# Patient Record
Sex: Male | Born: 1980 | Race: White | Hispanic: No | Marital: Single | Smoking: Former smoker
Health system: Southern US, Community
[De-identification: ages and names within clinical notes are randomized; demographics above are authoritative.]

## PROBLEM LIST (undated history)

## (undated) DIAGNOSIS — K219 Gastro-esophageal reflux disease without esophagitis: Secondary | ICD-10-CM

## (undated) DIAGNOSIS — B019 Varicella without complication: Secondary | ICD-10-CM

## (undated) DIAGNOSIS — J302 Other seasonal allergic rhinitis: Secondary | ICD-10-CM

## (undated) DIAGNOSIS — J45909 Unspecified asthma, uncomplicated: Secondary | ICD-10-CM

## (undated) HISTORY — DX: Unspecified asthma, uncomplicated: J45.909

## (undated) HISTORY — DX: Varicella without complication: B01.9

## (undated) HISTORY — PX: NECK SURGERY: SHX720

## (undated) HISTORY — PX: LEG SURGERY: SHX1003

## (undated) HISTORY — DX: Other seasonal allergic rhinitis: J30.2

## (undated) HISTORY — DX: Gastro-esophageal reflux disease without esophagitis: K21.9

---

## 2005-08-26 ENCOUNTER — Inpatient Hospital Stay (HOSPITAL_COMMUNITY): Admission: EM | Admit: 2005-08-26 | Discharge: 2005-08-28 | Payer: Self-pay | Admitting: *Deleted

## 2015-08-17 ENCOUNTER — Ambulatory Visit (INDEPENDENT_AMBULATORY_CARE_PROVIDER_SITE_OTHER): Payer: BLUE CROSS/BLUE SHIELD | Admitting: Primary Care

## 2015-08-17 ENCOUNTER — Encounter: Payer: Self-pay | Admitting: Primary Care

## 2015-08-17 ENCOUNTER — Ambulatory Visit (INDEPENDENT_AMBULATORY_CARE_PROVIDER_SITE_OTHER)
Admission: RE | Admit: 2015-08-17 | Discharge: 2015-08-17 | Disposition: A | Discharge status: Home / Self Care | Payer: BLUE CROSS/BLUE SHIELD | Source: Ambulatory Visit | Attending: Primary Care | Admitting: Primary Care

## 2015-08-17 VITALS — BP 146/92 | HR 85 | Temp 98.1°F | Ht 69.25 in | Wt 197.0 lb

## 2015-08-17 DIAGNOSIS — J45909 Unspecified asthma, uncomplicated: Secondary | ICD-10-CM

## 2015-08-17 DIAGNOSIS — K219 Gastro-esophageal reflux disease without esophagitis: Secondary | ICD-10-CM

## 2015-08-17 DIAGNOSIS — M542 Cervicalgia: Secondary | ICD-10-CM | POA: Diagnosis not present

## 2015-08-17 DIAGNOSIS — M79605 Pain in left leg: Secondary | ICD-10-CM

## 2015-08-17 MED ORDER — ALBUTEROL SULFATE HFA 108 (90 BASE) MCG/ACT IN AERS: 2.0000 | INHALATION_SPRAY | Freq: Four times a day (QID) | RESPIRATORY_TRACT | Status: DC | PRN

## 2015-08-17 MED ORDER — OMEPRAZOLE 20 MG PO CPDR: 20.0000 mg | DELAYED_RELEASE_CAPSULE | Freq: Every day | ORAL | Status: DC

## 2015-08-17 MED ORDER — CETIRIZINE HCL 10 MG PO TABS: 10.0000 mg | ORAL_TABLET | Freq: Every day | ORAL | Status: DC

## 2015-08-17 NOTE — Patient Instructions (Addendum)
Complete xray(s) prior to leaving today. I will notify you of your results once received.  Stop by the front desk and speak with either Shirlee LimerickMarion or Revonda StandardAllison regarding your referral to orthopedics.  Start Omeprazole 20 mg tablets for acid reflux. Take 1 tablet by mouth every morning.  Start Certrazine (Zyrtec) everyday for allergy and asthma. Take 1 tablet by mouth at bedtime.  Use your albuterol inhaler every 6 hours as needed for wheezing and shortness of breath. Please notify me if you require this inhaler more than 2-3 times weekly.  Please schedule a physical with me within the next 6 months. You may also schedule a lab only appointment 3-4 days prior. We will discuss your lab results in detail during your physical.  It was a pleasure to meet you today! Please don't hesitate to call me with any questions. Welcome to [facility]!  Food Choices for Gastroesophageal Reflux Disease, Adult When you have gastroesophageal reflux disease (GERD), the foods you eat and your eating habits are very important. Choosing the right foods can help ease the discomfort of GERD. WHAT GENERAL GUIDELINES DO I NEED TO FOLLOW?  Choose fruits, vegetables, whole grains, low-fat dairy products, and low-fat meat, fish, and poultry.  Limit fats such as oils, salad dressings, butter, nuts, and avocado.  Keep a food diary to identify foods that cause symptoms.  Avoid foods that cause reflux. These may be different for different people.  Eat frequent small meals instead of three large meals each day.  Eat your meals slowly, in a relaxed setting.  Limit fried foods.  Cook foods using methods other than frying.  Avoid drinking alcohol.  Avoid drinking large amounts of liquids with your meals.  Avoid bending over or lying down until 2-3 hours after eating. WHAT FOODS ARE NOT RECOMMENDED? The following are some foods and drinks that may worsen your symptoms: Vegetables Tomatoes. Tomato juice. Tomato and  spaghetti sauce. Chili peppers. Onion and garlic. Horseradish. Fruits Oranges, grapefruit, and lemon (fruit and juice). Meats High-fat meats, fish, and poultry. This includes hot dogs, ribs, ham, sausage, salami, and bacon. Dairy Whole milk and chocolate milk. Sour cream. Cream. Butter. Ice cream. Cream cheese.  Beverages Coffee and tea, with or without caffeine. Carbonated beverages or energy drinks. Condiments Hot sauce. Barbecue sauce.  Sweets/Desserts Chocolate and cocoa. Donuts. Peppermint and spearmint. Fats and Oils High-fat foods, including [place] fries and potato chips. Other Vinegar. Strong spices, such as black pepper, white pepper, red pepper, cayenne, curry powder, cloves, ginger, and chili powder. The items listed above may not be a complete list of foods and beverages to avoid. Contact your dietitian for more information.   This information is not intended to replace advice given to you by your health care provider. Make sure you discuss any questions you have with your health care provider.   Document Released: 05/26/2005 Document Revised: 06/16/2014 Document Reviewed: 03/30/2013 Elsevier Interactive Patient Education [employer]2016 Elsevier Inc.

## 2015-08-17 NOTE — Progress Notes (Signed)
Pre visit review using our clinic review tool, if applicable. No additional management support is needed unless otherwise documented below in the visit note. 
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
## 2015-08-18 DIAGNOSIS — J45909 Unspecified asthma, uncomplicated: Secondary | ICD-10-CM | POA: Insufficient documentation

## 2015-08-18 DIAGNOSIS — M542 Cervicalgia: Secondary | ICD-10-CM | POA: Insufficient documentation

## 2015-08-18 DIAGNOSIS — K219 Gastro-esophageal reflux disease without esophagitis: Secondary | ICD-10-CM | POA: Insufficient documentation

## 2015-08-18 DIAGNOSIS — M79605 Pain in left leg: Secondary | ICD-10-CM | POA: Insufficient documentation

## 2015-08-18 HISTORY — DX: Gastro-esophageal reflux disease without esophagitis: K21.9

## 2015-08-18 NOTE — Assessment & Plan Note (Signed)
Classic symptoms that occur daily. Poor diet, current smoker.  Information provided regarding triggers. Will have him start omeprazole 20 mg daily x 4 weeks.

## 2015-08-18 NOTE — Assessment & Plan Note (Signed)
Chronic, now with radiation to right shoulder.  Tender to C5-6 region. Xray today negative. May need MRI. Will have him discuss with ortho.
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
## 2015-08-18 NOTE — Assessment & Plan Note (Signed)
History of dating to childhood. Current smoker. Endorses wheezing in AM with cough. Discussed importance of tobacco cessation. Albuterol inhaler sent to pharmacy. Also will have him start Zyrtec HS. He is to notify me if he's requiring albuterol more than 2-3 times weekly. Will consider spirometry in the future.

## 2015-11-13 DIAGNOSIS — M4722 Other spondylosis with radiculopathy, cervical region: Secondary | ICD-10-CM | POA: Diagnosis not present

## 2015-11-13 DIAGNOSIS — M50223 Other cervical disc displacement at C6-C7 level: Secondary | ICD-10-CM | POA: Diagnosis not present

## 2015-11-13 DIAGNOSIS — M5020 Other cervical disc displacement, unspecified cervical region: Secondary | ICD-10-CM | POA: Diagnosis not present

## 2015-11-13 DIAGNOSIS — M50123 Cervical disc disorder at C6-C7 level with radiculopathy: Secondary | ICD-10-CM | POA: Diagnosis not present

## 2015-12-05 DIAGNOSIS — M50223 Other cervical disc displacement at C6-C7 level: Secondary | ICD-10-CM | POA: Diagnosis not present

## 2016-01-28 DIAGNOSIS — M47812 Spondylosis without myelopathy or radiculopathy, cervical region: Secondary | ICD-10-CM | POA: Diagnosis not present

## 2016-02-05 ENCOUNTER — Encounter: Payer: Self-pay | Admitting: Primary Care

## 2016-02-05 ENCOUNTER — Other Ambulatory Visit: Payer: Self-pay | Admitting: Primary Care

## 2016-02-05 DIAGNOSIS — Z0000 Encounter for general adult medical examination without abnormal findings: Secondary | ICD-10-CM

## 2016-02-14 ENCOUNTER — Other Ambulatory Visit: Payer: BLUE CROSS/BLUE SHIELD

## 2016-02-19 ENCOUNTER — Ambulatory Visit: Payer: BLUE CROSS/BLUE SHIELD | Admitting: Primary Care

## 2016-02-19 ENCOUNTER — Encounter: Payer: BLUE CROSS/BLUE SHIELD | Admitting: Primary Care

## 2016-02-19 DIAGNOSIS — Z0289 Encounter for other administrative examinations: Secondary | ICD-10-CM

## 2016-04-23 ENCOUNTER — Other Ambulatory Visit: Payer: Self-pay | Admitting: Primary Care

## 2016-04-23 DIAGNOSIS — J45909 Unspecified asthma, uncomplicated: Secondary | ICD-10-CM

## 2016-09-03 DIAGNOSIS — M50223 Other cervical disc displacement at C6-C7 level: Secondary | ICD-10-CM | POA: Diagnosis not present

## 2016-09-03 DIAGNOSIS — M47812 Spondylosis without myelopathy or radiculopathy, cervical region: Secondary | ICD-10-CM | POA: Diagnosis not present

## 2016-09-03 DIAGNOSIS — M542 Cervicalgia: Secondary | ICD-10-CM | POA: Diagnosis not present

## 2016-09-03 DIAGNOSIS — M5412 Radiculopathy, cervical region: Secondary | ICD-10-CM | POA: Diagnosis not present

## 2016-09-04 ENCOUNTER — Other Ambulatory Visit: Payer: Self-pay | Admitting: Neurosurgery

## 2016-09-04 DIAGNOSIS — M5412 Radiculopathy, cervical region: Secondary | ICD-10-CM

## 2016-09-05 ENCOUNTER — Other Ambulatory Visit: Payer: Self-pay | Admitting: Neurosurgery

## 2016-09-05 DIAGNOSIS — M542 Cervicalgia: Secondary | ICD-10-CM

## 2016-09-22 ENCOUNTER — Ambulatory Visit
Admission: RE | Admit: 2016-09-22 | Discharge: 2016-09-22 | Disposition: A | Discharge status: Home / Self Care | Payer: BLUE CROSS/BLUE SHIELD | Source: Ambulatory Visit | Attending: Neurosurgery | Admitting: Neurosurgery

## 2016-09-22 DIAGNOSIS — M542 Cervicalgia: Secondary | ICD-10-CM

## 2016-09-28 ENCOUNTER — Other Ambulatory Visit: Payer: Self-pay | Admitting: Primary Care

## 2016-09-28 DIAGNOSIS — J45909 Unspecified asthma, uncomplicated: Secondary | ICD-10-CM

## 2016-11-25 ENCOUNTER — Other Ambulatory Visit: Payer: Self-pay | Admitting: Primary Care

## 2016-11-25 DIAGNOSIS — J45909 Unspecified asthma, uncomplicated: Secondary | ICD-10-CM

## 2017-07-27 DIAGNOSIS — M542 Cervicalgia: Secondary | ICD-10-CM | POA: Diagnosis not present

## 2017-07-27 DIAGNOSIS — M50223 Other cervical disc displacement at C6-C7 level: Secondary | ICD-10-CM | POA: Diagnosis not present

## 2017-07-27 DIAGNOSIS — M4802 Spinal stenosis, cervical region: Secondary | ICD-10-CM | POA: Diagnosis not present

## 2017-07-27 DIAGNOSIS — M47812 Spondylosis without myelopathy or radiculopathy, cervical region: Secondary | ICD-10-CM | POA: Diagnosis not present

## 2017-07-27 DIAGNOSIS — M5412 Radiculopathy, cervical region: Secondary | ICD-10-CM | POA: Diagnosis not present

## 2017-09-28 ENCOUNTER — Ambulatory Visit: Payer: BLUE CROSS/BLUE SHIELD | Admitting: Primary Care

## 2017-09-28 DIAGNOSIS — Z0289 Encounter for other administrative examinations: Secondary | ICD-10-CM

## 2017-09-28 DIAGNOSIS — M542 Cervicalgia: Secondary | ICD-10-CM | POA: Diagnosis not present

## 2017-09-28 DIAGNOSIS — R0789 Other chest pain: Secondary | ICD-10-CM | POA: Diagnosis not present

## 2017-09-29 DIAGNOSIS — M542 Cervicalgia: Secondary | ICD-10-CM | POA: Diagnosis not present

## 2017-09-29 DIAGNOSIS — R030 Elevated blood-pressure reading, without diagnosis of hypertension: Secondary | ICD-10-CM | POA: Diagnosis not present

## 2017-09-29 DIAGNOSIS — S129XXA Fracture of neck, unspecified, initial encounter: Secondary | ICD-10-CM | POA: Diagnosis not present

## 2017-09-29 DIAGNOSIS — M5412 Radiculopathy, cervical region: Secondary | ICD-10-CM | POA: Diagnosis not present

## 2017-10-05 ENCOUNTER — Ambulatory Visit: Payer: BLUE CROSS/BLUE SHIELD | Admitting: Primary Care

## 2017-10-05 ENCOUNTER — Encounter: Payer: Self-pay | Admitting: Primary Care

## 2017-10-05 DIAGNOSIS — K219 Gastro-esophageal reflux disease without esophagitis: Secondary | ICD-10-CM

## 2017-10-05 DIAGNOSIS — J4530 Mild persistent asthma, uncomplicated: Secondary | ICD-10-CM

## 2017-10-05 DIAGNOSIS — J45909 Unspecified asthma, uncomplicated: Secondary | ICD-10-CM

## 2017-10-05 DIAGNOSIS — M542 Cervicalgia: Secondary | ICD-10-CM

## 2017-10-05 MED ORDER — CETIRIZINE HCL 10 MG PO TABS: 10.0000 mg | ORAL_TABLET | Freq: Every day | ORAL | 3 refills | Status: DC

## 2017-10-05 MED ORDER — ALBUTEROL SULFATE HFA 108 (90 BASE) MCG/ACT IN AERS: 2.0000 | INHALATION_SPRAY | Freq: Four times a day (QID) | RESPIRATORY_TRACT | 0 refills | Status: DC | PRN

## 2017-10-05 MED ORDER — OMEPRAZOLE 20 MG PO CPDR: 20.0000 mg | DELAYED_RELEASE_CAPSULE | Freq: Every day | ORAL | 3 refills | Status: DC

## 2017-10-05 MED ORDER — FLUTICASONE PROPIONATE HFA 44 MCG/ACT IN AERO: 2.0000 | INHALATION_SPRAY | Freq: Two times a day (BID) | RESPIRATORY_TRACT | 5 refills | Status: DC

## 2017-10-05 NOTE — Patient Instructions (Signed)
Start fluticasone inhaler. Inhale 2 puffs into the lungs twice daily, everyday.   Use the albuterol inhaler only as needed. Please call me if you still require use of this more than three times weekly on average.  Start omeprazole 20 mg capsules for heartburn.   Start Zyrtec once daily for allergies.  Please call me if the new inhaler is too expensive and/or is not effective.  It was a pleasure to see you today!

## 2017-10-05 NOTE — Assessment & Plan Note (Signed)
Daily symptoms with daily consumption of Tums. Discussed triggers including tobacco abuse, he is working to cut back.  Refill of omeprazole sent to pharmacy.

## 2017-10-05 NOTE — Assessment & Plan Note (Signed)
Daily use of albuterol, 365 days annually, which means asthma is not well controlled.   Start ICS with Flovent inhaler. Will start with 88 mcg BID then titrate as needed.  Discussed to call if ineffective or too costly. Also discussed to call if he's needing albuterol more than three times weekly. No wheezing on exam.

## 2017-10-05 NOTE — Assessment & Plan Note (Signed)
Chronic and following with orthopedics. Will likely undergo surgical intervention.

## 2017-10-05 NOTE — Progress Notes (Signed)
Subjective:    Patient ID: Luke Williams, male    DOB: May 02, 1981, 37 y.o.   MRN: [id number]  HPI  Luke Williams is a 37 year old male with a history of chronic neck pain, asthma, allergies, GERD who presents today for medication refill.  1) Chronic Neck Pain: History of cervical spinal fusion in 2006. Based off of PMP Aware he received a three day supply of Norco on 09/28/17 and a fifteen day supply of Percocet on 09/30/17. He is currently following with Orthopedics who is recommending surgical intervention.    2) GERD: Once managed on omeprazole 20 mg, is needing refills. He experiences esophageal burning and reflux daily and is taking two Tums daily on average.   3) Asthma/Allergies: Currently managed on albuterol HFA and Zyrtec. He's using his inhaler every morning for shortness of breath and wheezing. He's not had his Zyrtec at bedtime recently, was compliant before with improvement.   Review of Systems  Constitutional: Negative for fever.  HENT: Negative for congestion.   Respiratory: Positive for shortness of breath and wheezing. Negative for cough.   Gastrointestinal:       GERD  Musculoskeletal:       Chronic neck pain  Allergic/Immunologic: Positive for environmental allergies.       Past Medical History:  Diagnosis Date  . Asthma   . Chickenpox   . GERD (gastroesophageal reflux disease)   . Seasonal allergies      Social History   Socioeconomic History  . Marital status: Single    Spouse name: Not on file  . Number of children: Not on file  . Years of education: Not on file  . Highest education level: Not on file  Occupational History  . Not on file  Social Needs  . Financial resource strain: Not on file  . Food insecurity:    Worry: Not on file    Inability: Not on file  . Transportation needs:    Medical: Not on file    Non-medical: Not on file  Tobacco Use  . Smoking status: Current Every Day Smoker    Packs/day: 1.00    Types: Cigarettes  .  Smokeless tobacco: Never Used  Substance and Sexual Activity  . Alcohol use: No    Alcohol/week: 0.0 oz  . Drug use: No  . Sexual activity: Not on file  Lifestyle  . Physical activity:    Days per week: Not on file    Minutes per session: Not on file  . Stress: Not on file  Relationships  . Social connections:    Talks on phone: Not on file    Gets together: Not on file    Attends religious service: Not on file    Active member of club or organization: Not on file    Attends meetings of clubs or organizations: Not on file    Relationship status: Not on file  . Intimate partner violence:    Fear of current or ex partner: Not on file    Emotionally abused: Not on file    Physically abused: Not on file    Forced sexual activity: Not on file  Other Topics Concern  . Not on file  Social History Narrative   Married, separated.   2 children.   Works in [place].   Enjoys relaxing, golfing.      Family History  Problem Relation Age of Onset  . Arthritis Mother   . Hypertension Mother   . Alcohol abuse  Father   . Hyperlipidemia Father   . Hypertension Father   . Arthritis Maternal Grandmother   . Stroke Maternal Grandmother   . Hypertension Maternal Grandmother   . Colon cancer Maternal Grandfather   . Stroke Maternal Grandfather     No Known Allergies  Current Outpatient Medications on File Prior to Visit  Medication Sig Dispense Refill  . albuterol (PROAIR HFA) 108 (90 Base) MCG/ACT inhaler Inhale 2 puffs into the lungs every 6 (six) hours as needed for wheezing or shortness of breath. NEED OFFICE VISIT FOR ANY MORE REFILLS 8.5 Inhaler 0  . cetirizine (ZYRTEC) 10 MG tablet Take 1 tablet (10 mg total) by mouth daily. 30 tablet 11  . omeprazole (PRILOSEC) 20 MG capsule Take 1 capsule (20 mg total) by mouth daily. 30 capsule 11   No current facility-administered medications on file prior to visit.     BP 134/86   Pulse 82   Temp 98.3 F (36.8 C) (Oral)   Ht 5'  9.25" (1.759 m)   Wt 194 lb 12 oz (88.3 kg)   SpO2 98%   BMI 28.55 kg/m    Objective:   Physical Exam  Cardiovascular: Normal rate and regular rhythm.  Pulmonary/Chest: Breath sounds normal. He has no wheezes.  Abdominal: Soft.  Skin: Skin is warm and dry.          Assessment & Plan:

## 2017-10-12 ENCOUNTER — Other Ambulatory Visit: Payer: Self-pay | Admitting: Neurosurgery

## 2017-11-09 NOTE — Pre-Procedure Instructions (Signed)
Erroll LunaJoseph V Wilbanks  11/09/2017      [id number]- [place], [place] - [address] [place] [id number] Phone: [phone] Fax: [phone]    Your procedure is scheduled on June 11  Report to [facility] Admitting at [phone] A.M.  Call this number if you have problems the morning of surgery:  [phone]   Remember:  NOTHING TO EAT OR DRINK AFTER MIDNIGHT    Take these medicines the morning of surgery with A SIP OF WATER  albuterol (PROAIR HFA)  cetirizine (ZYRTEC)  omeprazole (PRILOSEC) oxyCODONE-acetaminophen (PERCOCET)   7 days prior to surgery STOP taking any Aspirin(unless otherwise instructed by your surgeon), Aleve, Naproxen, Ibuprofen, Motrin, Advil, Goody's, BC's, all herbal medications, fish oil, and all vitamins       Do not wear jewelry, make-up or nail polish.  Do not wear lotions, powders, or COLOGNE, or deodorant.    Men may shave face and neck.  Do not bring valuables to the hospital.  [facility] is not responsible for any belongings or valuables.  Contacts, dentures or bridgework may not be worn into surgery.  Leave your suitcase in the car.  After surgery it may be brought to your room.  For patients admitted to the hospital, discharge time will be determined by your treatment team.  Patients discharged the day of surgery will not be allowed to drive home.    Special instructions:   [facility]- Preparing For Surgery  Before surgery, you can play an important role. Because skin is not sterile, your skin needs to be as free of germs as possible. You can reduce the number of germs on your skin by washing with CHG (chlorahexidine gluconate) Soap before surgery.  CHG is an antiseptic cleaner which kills germs and bonds with the skin to continue killing germs even after washing.    Oral Hygiene is also important to reduce your risk of infection.  Remember - BRUSH YOUR TEETH THE MORNING OF SURGERY WITH YOUR  REGULAR TOOTHPASTE  Please do not use if you have an allergy to CHG or antibacterial soaps. If your skin becomes reddened/irritated stop using the CHG.  Do not shave (including legs and underarms) for at least 48 hours prior to first CHG shower. It is OK to shave your face.  Please follow these instructions carefully.   1. Shower the NIGHT BEFORE SURGERY and the MORNING OF SURGERY with CHG.   2. If you chose to wash your hair, wash your hair first as usual with your normal shampoo.  3. After you shampoo, rinse your hair and body thoroughly to remove the shampoo.  4. Use CHG as you would any other liquid soap. You can apply CHG directly to the skin and wash gently with a scrungie or a clean washcloth.   5. Apply the CHG Soap to your body ONLY FROM THE NECK DOWN.  Do not use on open wounds or open sores. Avoid contact with your eyes, ears, mouth and genitals (private parts). Wash Face and genitals (private parts)  with your normal soap.  6. Wash thoroughly, paying special attention to the area where your surgery will be performed.  7. Thoroughly rinse your body with warm water from the neck down.  8. DO NOT shower/wash with your normal soap after using and rinsing off the CHG Soap.  9. Pat yourself dry with a CLEAN TOWEL.  10. Wear CLEAN PAJAMAS to bed the night before surgery,  wear comfortable clothes the morning of surgery  11. Place CLEAN SHEETS on your bed the night of your first shower and DO NOT SLEEP WITH PETS.    Day of Surgery:  Do not apply any deodorants/lotions.  Please wear clean clothes to the hospital/surgery center.   Remember to brush your teeth WITH YOUR REGULAR TOOTHPASTE.    Please read over the following fact sheets that you were given.

## 2017-11-10 ENCOUNTER — Encounter (HOSPITAL_COMMUNITY): Payer: Self-pay

## 2017-11-10 ENCOUNTER — Encounter (HOSPITAL_COMMUNITY)
Admission: RE | Admit: 2017-11-10 | Discharge: 2017-11-10 | Disposition: A | Discharge status: Home / Self Care | Payer: BLUE CROSS/BLUE SHIELD | Source: Ambulatory Visit | Attending: Neurosurgery | Admitting: Neurosurgery

## 2017-11-10 ENCOUNTER — Other Ambulatory Visit: Payer: Self-pay

## 2017-11-10 DIAGNOSIS — M542 Cervicalgia: Secondary | ICD-10-CM | POA: Diagnosis not present

## 2017-11-10 DIAGNOSIS — Z01818 Encounter for other preprocedural examination: Secondary | ICD-10-CM | POA: Diagnosis not present

## 2017-11-10 LAB — CBC
HEMATOCRIT: 35.9 % — AB (ref 39.0–52.0)
HEMOGLOBIN: 10.4 g/dL — AB (ref 13.0–17.0)
MCH: 20.1 pg — ABNORMAL LOW (ref 26.0–34.0)
MCHC: 29 g/dL — ABNORMAL LOW (ref 30.0–36.0)
MCV: 69.3 fL — ABNORMAL LOW (ref 78.0–100.0)
Platelets: 316 10*3/uL (ref 150–400)
RBC: 5.18 MIL/uL (ref 4.22–5.81)
RDW: 20.9 % — ABNORMAL HIGH (ref 11.5–15.5)
WBC: 4.6 10*3/uL (ref 4.0–10.5)

## 2017-11-10 LAB — TYPE AND SCREEN
ABO/RH(D): A NEG
ANTIBODY SCREEN: NEGATIVE

## 2017-11-10 LAB — SURGICAL PCR SCREEN
MRSA, PCR: NEGATIVE
STAPHYLOCOCCUS AUREUS: NEGATIVE

## 2017-11-15 ENCOUNTER — Other Ambulatory Visit: Payer: Self-pay | Admitting: Primary Care

## 2017-11-15 DIAGNOSIS — J45909 Unspecified asthma, uncomplicated: Secondary | ICD-10-CM

## 2017-11-16 NOTE — Telephone Encounter (Signed)
Ok to refill? Electronically refill request for albuterol [facility](PROAIR HFA) 108 (90 Base) MCG/ACT inhaler  Last prescribed on 10/05/2017  Last seen on 10/05/2017

## 2017-11-17 ENCOUNTER — Inpatient Hospital Stay (HOSPITAL_COMMUNITY): Payer: BLUE CROSS/BLUE SHIELD

## 2017-11-17 ENCOUNTER — Other Ambulatory Visit: Payer: Self-pay

## 2017-11-17 ENCOUNTER — Inpatient Hospital Stay (HOSPITAL_COMMUNITY): Payer: BLUE CROSS/BLUE SHIELD | Admitting: Certified Registered Nurse Anesthetist

## 2017-11-17 ENCOUNTER — Inpatient Hospital Stay (HOSPITAL_COMMUNITY)
Admission: RE | Admit: 2017-11-17 | Discharge: 2017-11-17 | DRG: 473 | Disposition: A | Discharge status: Home / Self Care | Payer: BLUE CROSS/BLUE SHIELD | Attending: Neurosurgery | Admitting: Neurosurgery

## 2017-11-17 ENCOUNTER — Encounter (HOSPITAL_COMMUNITY): Payer: Self-pay | Admitting: Surgery

## 2017-11-17 ENCOUNTER — Encounter (HOSPITAL_COMMUNITY)
Admission: RE | Disposition: A | Discharge status: Home / Self Care | Payer: Self-pay | Source: Home / Self Care | Attending: Neurosurgery

## 2017-11-17 DIAGNOSIS — Z981 Arthrodesis status: Secondary | ICD-10-CM | POA: Diagnosis not present

## 2017-11-17 DIAGNOSIS — S129XXA Fracture of neck, unspecified, initial encounter: Secondary | ICD-10-CM | POA: Diagnosis present

## 2017-11-17 DIAGNOSIS — M5412 Radiculopathy, cervical region: Secondary | ICD-10-CM | POA: Diagnosis present

## 2017-11-17 DIAGNOSIS — F17200 Nicotine dependence, unspecified, uncomplicated: Secondary | ICD-10-CM | POA: Diagnosis not present

## 2017-11-17 DIAGNOSIS — Y838 Other surgical procedures as the cause of abnormal reaction of the patient, or of later complication, without mention of misadventure at the time of the procedure: Secondary | ICD-10-CM | POA: Diagnosis present

## 2017-11-17 DIAGNOSIS — Z419 Encounter for procedure for purposes other than remedying health state, unspecified: Secondary | ICD-10-CM

## 2017-11-17 DIAGNOSIS — Z720 Tobacco use: Secondary | ICD-10-CM | POA: Diagnosis not present

## 2017-11-17 DIAGNOSIS — K219 Gastro-esophageal reflux disease without esophagitis: Secondary | ICD-10-CM | POA: Diagnosis not present

## 2017-11-17 DIAGNOSIS — J45909 Unspecified asthma, uncomplicated: Secondary | ICD-10-CM | POA: Diagnosis not present

## 2017-11-17 DIAGNOSIS — R030 Elevated blood-pressure reading, without diagnosis of hypertension: Secondary | ICD-10-CM | POA: Diagnosis present

## 2017-11-17 DIAGNOSIS — M960 Pseudarthrosis after fusion or arthrodesis: Secondary | ICD-10-CM | POA: Diagnosis not present

## 2017-11-17 DIAGNOSIS — M542 Cervicalgia: Secondary | ICD-10-CM | POA: Diagnosis not present

## 2017-11-17 HISTORY — PX: POSTERIOR CERVICAL FUSION/FORAMINOTOMY: SHX5038

## 2017-11-17 SURGERY — POSTERIOR CERVICAL FUSION/FORAMINOTOMY LEVEL 1
Anesthesia: General | Site: Spine Cervical

## 2017-11-17 MED ORDER — OXYCODONE-ACETAMINOPHEN 5-325 MG PO TABS
1.0000 | ORAL_TABLET | ORAL | Status: DC | PRN
  Administered 2017-11-17: 2 via ORAL
  Filled 2017-11-17: qty 2

## 2017-11-17 MED ORDER — SODIUM CHLORIDE 0.9% FLUSH
3.0000 mL | Freq: Two times a day (BID) | INTRAVENOUS | Status: DC
  Administered 2017-11-17: 3 mL via INTRAVENOUS

## 2017-11-17 MED ORDER — MIDAZOLAM HCL 2 MG/2ML IJ SOLN
INTRAMUSCULAR | Status: AC
  Filled 2017-11-17: qty 2

## 2017-11-17 MED ORDER — LIDOCAINE-EPINEPHRINE 1 %-1:100000 IJ SOLN
INTRAMUSCULAR | Status: AC
  Filled 2017-11-17: qty 1

## 2017-11-17 MED ORDER — CEFAZOLIN SODIUM-DEXTROSE 2-4 GM/100ML-% IV SOLN
INTRAVENOUS | Status: AC
  Filled 2017-11-17: qty 100

## 2017-11-17 MED ORDER — CHLORHEXIDINE GLUCONATE CLOTH 2 % EX PADS: 6.0000 | MEDICATED_PAD | Freq: Once | CUTANEOUS | Status: DC

## 2017-11-17 MED ORDER — THROMBIN 5000 UNITS EX SOLR
CUTANEOUS | Status: DC | PRN
  Administered 2017-11-17 (×2): 5000 [IU] via TOPICAL

## 2017-11-17 MED ORDER — ALBUTEROL SULFATE (2.5 MG/3ML) 0.083% IN NEBU: 2.5000 mg | INHALATION_SOLUTION | Freq: Four times a day (QID) | RESPIRATORY_TRACT | Status: DC | PRN

## 2017-11-17 MED ORDER — MENTHOL 3 MG MT LOZG: 1.0000 | LOZENGE | OROMUCOSAL | Status: DC | PRN

## 2017-11-17 MED ORDER — ONDANSETRON HCL 4 MG/2ML IJ SOLN
INTRAMUSCULAR | Status: AC
  Filled 2017-11-17: qty 2

## 2017-11-17 MED ORDER — BACITRACIN ZINC 500 UNIT/GM EX OINT
TOPICAL_OINTMENT | CUTANEOUS | Status: AC
  Filled 2017-11-17: qty 28.35

## 2017-11-17 MED ORDER — ALUM & MAG HYDROXIDE-SIMETH 200-200-20 MG/5ML PO SUSP: 30.0000 mL | Freq: Four times a day (QID) | ORAL | Status: DC | PRN

## 2017-11-17 MED ORDER — PROPOFOL 10 MG/ML IV BOLUS
INTRAVENOUS | Status: AC
  Filled 2017-11-17: qty 20

## 2017-11-17 MED ORDER — OXYCODONE HCL 5 MG PO TABS
ORAL_TABLET | ORAL | Status: AC
  Filled 2017-11-17: qty 1

## 2017-11-17 MED ORDER — LIDOCAINE-EPINEPHRINE 1 %-1:100000 IJ SOLN
INTRAMUSCULAR | Status: DC | PRN
  Administered 2017-11-17: 5 mL

## 2017-11-17 MED ORDER — KCL IN DEXTROSE-NACL 20-5-0.45 MEQ/L-%-% IV SOLN: INTRAVENOUS | Status: DC

## 2017-11-17 MED ORDER — PROPOFOL 10 MG/ML IV BOLUS
INTRAVENOUS | Status: AC
Start: 2017-11-17 — End: ?
  Filled 2017-11-17: qty 20

## 2017-11-17 MED ORDER — LIDOCAINE HCL (CARDIAC) PF 100 MG/5ML IV SOSY
PREFILLED_SYRINGE | INTRAVENOUS | Status: DC | PRN
  Administered 2017-11-17: 50 mg via INTRAVENOUS

## 2017-11-17 MED ORDER — BUDESONIDE 0.5 MG/2ML IN SUSP
0.5000 mg | Freq: Two times a day (BID) | RESPIRATORY_TRACT | Status: DC
  Filled 2017-11-17 (×2): qty 2

## 2017-11-17 MED ORDER — ONDANSETRON HCL 4 MG/2ML IJ SOLN: 4.0000 mg | Freq: Four times a day (QID) | INTRAMUSCULAR | Status: DC | PRN

## 2017-11-17 MED ORDER — FENTANYL CITRATE (PF) 250 MCG/5ML IJ SOLN
INTRAMUSCULAR | Status: DC | PRN
  Administered 2017-11-17 (×6): 50 ug via INTRAVENOUS
  Administered 2017-11-17 (×2): 100 ug via INTRAVENOUS

## 2017-11-17 MED ORDER — BUPIVACAINE HCL (PF) 0.5 % IJ SOLN
INTRAMUSCULAR | Status: DC | PRN
  Administered 2017-11-17: 5 mL

## 2017-11-17 MED ORDER — DOCUSATE SODIUM 100 MG PO CAPS: 100.0000 mg | ORAL_CAPSULE | Freq: Two times a day (BID) | ORAL | Status: DC

## 2017-11-17 MED ORDER — CEFAZOLIN SODIUM-DEXTROSE 2-4 GM/100ML-% IV SOLN: 2.0000 g | Freq: Three times a day (TID) | INTRAVENOUS | Status: DC

## 2017-11-17 MED ORDER — OXYCODONE HCL 5 MG PO TABS
5.0000 mg | ORAL_TABLET | ORAL | Status: DC | PRN
  Administered 2017-11-17: 10 mg via ORAL
  Filled 2017-11-17: qty 2

## 2017-11-17 MED ORDER — ACETAMINOPHEN 650 MG RE SUPP: 650.0000 mg | RECTAL | Status: DC | PRN

## 2017-11-17 MED ORDER — TIZANIDINE HCL 4 MG PO TABS: 4.0000 mg | ORAL_TABLET | Freq: Two times a day (BID) | ORAL | Status: DC | PRN

## 2017-11-17 MED ORDER — DEXAMETHASONE SODIUM PHOSPHATE 10 MG/ML IJ SOLN
INTRAMUSCULAR | Status: DC | PRN
  Administered 2017-11-17: 10 mg via INTRAVENOUS

## 2017-11-17 MED ORDER — LORATADINE 10 MG PO TABS: 10.0000 mg | ORAL_TABLET | Freq: Every day | ORAL | Status: DC

## 2017-11-17 MED ORDER — ONDANSETRON HCL 4 MG/2ML IJ SOLN
INTRAMUSCULAR | Status: DC | PRN
  Administered 2017-11-17: 4 mg via INTRAVENOUS

## 2017-11-17 MED ORDER — 0.9 % SODIUM CHLORIDE (POUR BTL) OPTIME
TOPICAL | Status: DC | PRN
  Administered 2017-11-17: 1000 mL

## 2017-11-17 MED ORDER — SUGAMMADEX SODIUM 200 MG/2ML IV SOLN
INTRAVENOUS | Status: DC | PRN
  Administered 2017-11-17: 200 mg via INTRAVENOUS

## 2017-11-17 MED ORDER — ROCURONIUM BROMIDE 100 MG/10ML IV SOLN
INTRAVENOUS | Status: DC | PRN
  Administered 2017-11-17 (×2): 20 mg via INTRAVENOUS
  Administered 2017-11-17: 50 mg via INTRAVENOUS
  Administered 2017-11-17: 30 mg via INTRAVENOUS

## 2017-11-17 MED ORDER — THROMBIN 5000 UNITS EX SOLR
CUTANEOUS | Status: AC
  Filled 2017-11-17: qty 10000

## 2017-11-17 MED ORDER — CEFAZOLIN SODIUM-DEXTROSE 2-4 GM/100ML-% IV SOLN
2.0000 g | INTRAVENOUS | Status: AC
  Administered 2017-11-17: 2 g via INTRAVENOUS

## 2017-11-17 MED ORDER — PROPOFOL 10 MG/ML IV BOLUS
INTRAVENOUS | Status: DC | PRN
  Administered 2017-11-17: 20 mg via INTRAVENOUS
  Administered 2017-11-17: 180 mg via INTRAVENOUS

## 2017-11-17 MED ORDER — ZOLPIDEM TARTRATE 5 MG PO TABS: 5.0000 mg | ORAL_TABLET | Freq: Every evening | ORAL | Status: DC | PRN

## 2017-11-17 MED ORDER — DIAZEPAM 5 MG PO TABS
5.0000 mg | ORAL_TABLET | Freq: Four times a day (QID) | ORAL | Status: DC | PRN
  Administered 2017-11-17: 5 mg via ORAL

## 2017-11-17 MED ORDER — ACETAMINOPHEN 325 MG PO TABS
650.0000 mg | ORAL_TABLET | ORAL | Status: DC | PRN
Start: 2017-11-17 — End: 2017-11-17

## 2017-11-17 MED ORDER — SODIUM CHLORIDE 0.9% FLUSH: 3.0000 mL | INTRAVENOUS | Status: DC | PRN

## 2017-11-17 MED ORDER — OXYCODONE HCL 5 MG PO TABS: 5.0000 mg | ORAL_TABLET | ORAL | Status: DC | PRN

## 2017-11-17 MED ORDER — SUGAMMADEX SODIUM 200 MG/2ML IV SOLN
INTRAVENOUS | Status: AC
  Filled 2017-11-17: qty 2

## 2017-11-17 MED ORDER — FENTANYL CITRATE (PF) 100 MCG/2ML IJ SOLN: 25.0000 ug | INTRAMUSCULAR | Status: DC | PRN

## 2017-11-17 MED ORDER — MORPHINE SULFATE (PF) 2 MG/ML IV SOLN: 2.0000 mg | INTRAVENOUS | Status: DC | PRN

## 2017-11-17 MED ORDER — PHENOL 1.4 % MT LIQD: 1.0000 | OROMUCOSAL | Status: DC | PRN

## 2017-11-17 MED ORDER — POLYETHYLENE GLYCOL 3350 17 G PO PACK: 17.0000 g | PACK | Freq: Every day | ORAL | Status: DC | PRN

## 2017-11-17 MED ORDER — FENTANYL CITRATE (PF) 250 MCG/5ML IJ SOLN
INTRAMUSCULAR | Status: AC
  Filled 2017-11-17: qty 5

## 2017-11-17 MED ORDER — ONDANSETRON HCL 4 MG/2ML IJ SOLN: 4.0000 mg | Freq: Once | INTRAMUSCULAR | Status: DC | PRN

## 2017-11-17 MED ORDER — OXYCODONE HCL 5 MG/5ML PO SOLN: 5.0000 mg | Freq: Once | ORAL | Status: AC | PRN

## 2017-11-17 MED ORDER — BUPIVACAINE HCL (PF) 0.5 % IJ SOLN
INTRAMUSCULAR | Status: AC
  Filled 2017-11-17: qty 30

## 2017-11-17 MED ORDER — FAMOTIDINE 20 MG PO TABS: 20.0000 mg | ORAL_TABLET | Freq: Two times a day (BID) | ORAL | Status: DC

## 2017-11-17 MED ORDER — BACITRACIN ZINC 500 UNIT/GM EX OINT
TOPICAL_OINTMENT | CUTANEOUS | Status: DC | PRN
  Administered 2017-11-17: 1 via TOPICAL

## 2017-11-17 MED ORDER — FLEET ENEMA 7-19 GM/118ML RE ENEM: 1.0000 | ENEMA | Freq: Once | RECTAL | Status: DC | PRN

## 2017-11-17 MED ORDER — HYDROCODONE-ACETAMINOPHEN 5-325 MG PO TABS: 2.0000 | ORAL_TABLET | ORAL | Status: DC | PRN

## 2017-11-17 MED ORDER — PANTOPRAZOLE SODIUM 40 MG PO TBEC: 40.0000 mg | DELAYED_RELEASE_TABLET | Freq: Every day | ORAL | Status: DC

## 2017-11-17 MED ORDER — MIDAZOLAM HCL 5 MG/5ML IJ SOLN
INTRAMUSCULAR | Status: DC | PRN
  Administered 2017-11-17: 2 mg via INTRAVENOUS

## 2017-11-17 MED ORDER — OXYCODONE-ACETAMINOPHEN 10-325 MG PO TABS: 1.0000 | ORAL_TABLET | ORAL | Status: DC | PRN

## 2017-11-17 MED ORDER — BISACODYL 10 MG RE SUPP: 10.0000 mg | Freq: Every day | RECTAL | Status: DC | PRN

## 2017-11-17 MED ORDER — DIAZEPAM 5 MG PO TABS
ORAL_TABLET | ORAL | Status: AC
  Filled 2017-11-17: qty 1

## 2017-11-17 MED ORDER — METHYLPREDNISOLONE ACETATE 80 MG/ML IJ SUSP
INTRAMUSCULAR | Status: AC
  Filled 2017-11-17: qty 1

## 2017-11-17 MED ORDER — LIDOCAINE 2% (20 MG/ML) 5 ML SYRINGE
INTRAMUSCULAR | Status: AC
  Filled 2017-11-17: qty 5

## 2017-11-17 MED ORDER — OXYCODONE HCL 5 MG PO TABS
5.0000 mg | ORAL_TABLET | Freq: Once | ORAL | Status: AC | PRN
  Administered 2017-11-17: 5 mg via ORAL

## 2017-11-17 MED ORDER — ONDANSETRON HCL 4 MG PO TABS: 4.0000 mg | ORAL_TABLET | Freq: Four times a day (QID) | ORAL | Status: DC | PRN

## 2017-11-17 MED ORDER — LACTATED RINGERS IV SOLN
INTRAVENOUS | Status: DC
  Administered 2017-11-17 (×2): via INTRAVENOUS

## 2017-11-17 MED ORDER — DEXAMETHASONE SODIUM PHOSPHATE 10 MG/ML IJ SOLN
INTRAMUSCULAR | Status: AC
  Filled 2017-11-17: qty 1

## 2017-11-17 SURGICAL SUPPLY — 69 items
BIT DRILL NEURO 2X3.1 SFT TUCH (MISCELLANEOUS) IMPLANT
BIT DRILL VUEPOINT II (BIT) ×1 IMPLANT
BLADE CLIPPER SURG (BLADE) IMPLANT
BLADE SURG 11 STRL SS (BLADE) IMPLANT
BLADE ULTRA TIP 2M (BLADE) IMPLANT
BUR PRECISION FLUTE 5.0 (BURR) IMPLANT
CANISTER SUCT 3000ML PPV (MISCELLANEOUS) ×2 IMPLANT
CARTRIDGE OIL MAESTRO DRILL (MISCELLANEOUS) ×1 IMPLANT
DECANTER SPIKE VIAL GLASS SM (MISCELLANEOUS) ×2 IMPLANT
DERMABOND ADVANCED (GAUZE/BANDAGES/DRESSINGS) ×1
DERMABOND ADVANCED .7 DNX12 (GAUZE/BANDAGES/DRESSINGS) ×1 IMPLANT
DIFFUSER DRILL AIR PNEUMATIC (MISCELLANEOUS) ×2 IMPLANT
DRAPE C-ARM 42X72 X-RAY (DRAPES) ×4 IMPLANT
DRAPE LAPAROTOMY 100X72 PEDS (DRAPES) ×2 IMPLANT
DRAPE MICROSCOPE LEICA (MISCELLANEOUS) IMPLANT
DRILL BIT VUEPOINT II (BIT) ×1
DRILL NEURO 2X3.1 SOFT TOUCH (MISCELLANEOUS)
DRSG OPSITE POSTOP 4X6 (GAUZE/BANDAGES/DRESSINGS) ×2 IMPLANT
DRSG PAD ABDOMINAL 8X10 ST (GAUZE/BANDAGES/DRESSINGS) IMPLANT
DURAPREP 6ML APPLICATOR 50/CS (WOUND CARE) ×2 IMPLANT
ELECT REM PT RETURN 9FT ADLT (ELECTROSURGICAL) ×2
ELECTRODE REM PT RTRN 9FT ADLT (ELECTROSURGICAL) ×1 IMPLANT
GAUZE SPONGE 4X4 12PLY STRL (GAUZE/BANDAGES/DRESSINGS) ×2 IMPLANT
GAUZE SPONGE 4X4 16PLY XRAY LF (GAUZE/BANDAGES/DRESSINGS) IMPLANT
GLOVE BIO SURGEON STRL SZ8 (GLOVE) ×2 IMPLANT
GLOVE BIOGEL PI IND STRL 8 (GLOVE) ×1 IMPLANT
GLOVE BIOGEL PI IND STRL 8.5 (GLOVE) ×1 IMPLANT
GLOVE BIOGEL PI INDICATOR 8 (GLOVE) ×1
GLOVE BIOGEL PI INDICATOR 8.5 (GLOVE) ×1
GLOVE ECLIPSE 8.0 STRL XLNG CF (GLOVE) ×2 IMPLANT
GLOVE EXAM NITRILE LRG STRL (GLOVE) IMPLANT
GLOVE EXAM NITRILE XL STR (GLOVE) IMPLANT
GLOVE EXAM NITRILE XS STR PU (GLOVE) IMPLANT
GOWN STRL REUS W/ TWL LRG LVL3 (GOWN DISPOSABLE) IMPLANT
GOWN STRL REUS W/ TWL XL LVL3 (GOWN DISPOSABLE) IMPLANT
GOWN STRL REUS W/TWL 2XL LVL3 (GOWN DISPOSABLE) ×2 IMPLANT
GOWN STRL REUS W/TWL LRG LVL3 (GOWN DISPOSABLE)
GOWN STRL REUS W/TWL XL LVL3 (GOWN DISPOSABLE)
HEMOSTAT SURGICEL 2X14 (HEMOSTASIS) IMPLANT
KIT BASIN OR (CUSTOM PROCEDURE TRAY) ×2 IMPLANT
KIT INFUSE XX SMALL 0.7CC (Orthopedic Implant) ×2 IMPLANT
KIT TURNOVER KIT B (KITS) ×2 IMPLANT
MARKER SKIN DUAL TIP RULER LAB (MISCELLANEOUS) ×2 IMPLANT
NEEDLE HYPO 18GX1.5 BLUNT FILL (NEEDLE) IMPLANT
NEEDLE HYPO 25X1 1.5 SAFETY (NEEDLE) ×2 IMPLANT
NEEDLE SPNL 22GX3.5 QUINCKE BK (NEEDLE) ×2 IMPLANT
NS IRRIG 1000ML POUR BTL (IV SOLUTION) ×2 IMPLANT
OIL CARTRIDGE MAESTRO DRILL (MISCELLANEOUS) ×2
PACK LAMINECTOMY NEURO (CUSTOM PROCEDURE TRAY) ×2 IMPLANT
PIN MAYFIELD SKULL DISP (PIN) ×2 IMPLANT
PUTTY BONE ATTRAX 5CC STRIP (Putty) ×2 IMPLANT
ROD VUEPOINT 3.5X60 (Rod) ×2 IMPLANT
RUBBERBAND STERILE (MISCELLANEOUS) IMPLANT
SCREW MA MM 3.5X12 (Screw) ×8 IMPLANT
SCREW SET THREADED (Screw) ×8 IMPLANT
SPONGE INTESTINAL PEANUT (DISPOSABLE) IMPLANT
SPONGE SURGIFOAM ABS GEL SZ50 (HEMOSTASIS) ×2 IMPLANT
STAPLER SKIN PROX WIDE 3.9 (STAPLE) ×2 IMPLANT
SUT ETHILON 3 0 FSL (SUTURE) ×2 IMPLANT
SUT VIC AB 0 CT1 18XCR BRD8 (SUTURE) ×1 IMPLANT
SUT VIC AB 0 CT1 8-18 (SUTURE) ×1
SUT VIC AB 2-0 CP2 18 (SUTURE) ×2 IMPLANT
SUT VIC AB 3-0 SH 8-18 (SUTURE) IMPLANT
SYR 3ML LL SCALE MARK (SYRINGE) IMPLANT
TOWEL GREEN STERILE (TOWEL DISPOSABLE) ×2 IMPLANT
TOWEL GREEN STERILE FF (TOWEL DISPOSABLE) ×2 IMPLANT
TRAY FOLEY MTR SLVR 16FR STAT (SET/KITS/TRAYS/PACK) IMPLANT
UNDERPAD 30X30 (UNDERPADS AND DIAPERS) ×2 IMPLANT
WATER STERILE IRR 1000ML POUR (IV SOLUTION) ×2 IMPLANT

## 2017-11-17 NOTE — Transfer of Care (Signed)
Immediate Anesthesia Transfer of Care Note  Patient: Luke Williams  Procedure(s) Performed: CERVICAL SIX- CERVICAL SEVEN POSTERIOR CERVICAL FUSION (N/A Spine Cervical)  Patient Location: PACU  Anesthesia Type:General  Level of Consciousness: awake, oriented and patient cooperative  Airway & Oxygen Therapy: Patient Spontanous Breathing and Patient connected to nasal cannula oxygen  Post-op Assessment: Report given to RN and Post -op Vital signs reviewed and stable  Post vital signs: Reviewed  Last Vitals:  Vitals Value Taken Time  BP 159/90 11/17/2017 12:38 PM  Temp    Pulse 90 11/17/2017 12:41 PM  Resp 14 11/17/2017 12:41 PM  SpO2 99 % 11/17/2017 12:41 PM  Vitals shown include unvalidated device data.  Last Pain:  Vitals:   11/17/17 0836  TempSrc:   PainSc: 7       Patients Stated Pain Goal: 4 (11/17/17 0836)  Complications: No apparent anesthesia complications

## 2017-11-17 NOTE — Interval H&P Note (Signed)
History and Physical Interval Note:  11/17/2017 10:05 AM  Luke Williams  has presented today for surgery, with the diagnosis of PSEUDOARTHROSIS OF CERVICAL SPINE  The various methods of treatment have been discussed with the patient and family. After consideration of risks, benefits and other options for treatment, the patient has consented to  Procedure(s) with comments: CERVICAL 6- CERVICAL 7 POSTERIOR CERVICAL FUSION (N/A) - CERVICAL 6- CERVICAL 7 POSTERIOR CERVICAL FUSION as a surgical intervention .  The patient's history has been reviewed, patient examined, no change in status, stable for surgery.  I have reviewed the patient's chart and labs.  Questions were answered to the patient's satisfaction.     Luke Williams,Tavita D

## 2017-11-17 NOTE — Evaluation (Signed)
Occupational Therapy Evaluation and Discharge Patient Details Name: Luke Williams MRN: [id number] DOB: 06-06-1981 Today's Date: 11/17/2017    History of Present Illness Pt is a 37 y.o. male s/p C6-7 Posterior cervical fusion. No pertinent PMHx in chart.    Clinical Impression   Pt reports he was independent with ADL PTA. Currently pt supervision with ADL and functional mobility. All neck, safety, and ADL education completed with pt. Pt planning to d/c home with 24/7 supervision from family. No further acute OT needs identified; signing off at this time. Please re-consult if needs change. Thank you for this referral.    Follow Up Recommendations  No OT follow up;Supervision - Intermittent    Equipment Recommendations  None recommended by OT    Recommendations for Other Services       Precautions / Restrictions Precautions Precautions: Cervical Precaution Booklet Issued: Yes (comment) Precaution Comments: Reviewed cervical precautions with pt Required Braces or Orthoses: Cervical Brace Cervical Brace: Hard collar Restrictions Weight Bearing Restrictions: No      Mobility Bed Mobility Overal bed mobility: Needs Assistance Bed Mobility: Supine to Sit     Supine to sit: Supervision;HOB elevated     General bed mobility comments: Educated pt on log roll technique from flat bed.  Transfers Overall transfer level: Needs assistance Equipment used: None Transfers: Sit to/from Stand Sit to Stand: Supervision         General transfer comment: for safety, no physical assist needed    Balance Overall balance assessment: Mild deficits observed, not formally tested                                         ADL either performed or assessed with clinical judgement   ADL Overall ADL's : Needs assistance/impaired Eating/Feeding: Set up;Sitting   Grooming: Supervision/safety;Standing Grooming Details (indicate cue type and reason): Educated on use of 2  cups for oral care Upper Body Bathing: Set up;Sitting   Lower Body Bathing: Supervison/ safety;Sit to/from stand   Upper Body Dressing : Set up;Sitting Upper Body Dressing Details (indicate cue type and reason): Pt reports he is familiar with donning/doffing hard collar and proper positioning Lower Body Dressing: Supervision/safety;Sit to/from stand Lower Body Dressing Details (indicate cue type and reason): Educated on compensatory strategies for LB ADL Toilet Transfer: Supervision/safety;Ambulation Toilet Transfer Details (indicate cue type and reason): Simulated by sit to stand from EOB with functional mobility     Tub/ Shower Transfer: Supervision/safety;Tub transfer;Ambulation Tub/Shower Transfer Details (indicate cue type and reason): Simulated in room with supervision for safety; discussed fall prevention strategies Functional mobility during ADLs: Supervision/safety General ADL Comments: Educated pt on maintaining cervical precautions during functional activities, log roll for bed mobility     Vision         Perception     Praxis      Pertinent Vitals/Pain Pain Assessment: Faces Faces Pain Scale: Hurts even more Pain Location: neck Pain Descriptors / Indicators: Aching Pain Intervention(s): Monitored during session;Repositioned     Hand Dominance     Extremity/Trunk Assessment Upper Extremity Assessment Upper Extremity Assessment: Overall WFL for tasks assessed(mild sensation deficits in digits, no worse than PTA)   Lower Extremity Assessment Lower Extremity Assessment: Defer to PT evaluation   Cervical / Trunk Assessment Cervical / Trunk Assessment: Other exceptions Cervical / Trunk Exceptions: s/p cervical fusion   Communication Communication Communication: No difficulties  Cognition Arousal/Alertness: Awake/alert Behavior During Therapy: WFL for tasks assessed/performed Overall Cognitive Status: Within Functional Limits for tasks assessed                                      General Comments       Exercises     Shoulder Instructions      Home Living Family/patient expects to be discharged to:: Private residence Living Arrangements: Spouse/significant other Available Help at Discharge: Family;Available 24 hours/day Type of Home: Mobile home Home Access: Stairs to enter Entrance Stairs-Number of Steps: 4   Home Layout: One level     Bathroom Shower/Tub: Chief Strategy Officer: Standard     Home Equipment: None          Prior Functioning/Environment Level of Independence: Independent                 OT Problem List:        OT Treatment/Interventions:      OT Goals(Current goals can be found in the care plan section) Acute Rehab OT Goals Patient Stated Goal: home today OT Goal Formulation: All assessment and education complete, DC therapy  OT Frequency:     Barriers to D/C:            Co-evaluation              AM-PAC PT "6 Clicks" Daily Activity     Outcome Measure Help from another person eating meals?: None Help from another person taking care of personal grooming?: A Little Help from another person toileting, which includes using toliet, bedpan, or urinal?: A Little Help from another person bathing (including washing, rinsing, drying)?: A Little Help from another person to put on and taking off regular upper body clothing?: None Help from another person to put on and taking off regular lower body clothing?: A Little 6 Click Score: 20   End of Session Equipment Utilized During Treatment: Cervical collar Nurse Communication: Mobility status;Other (comment)(no equipment or f/u needs)  Activity Tolerance: Patient tolerated treatment well Patient left: with call bell/phone within reach;with family/visitor present;Other (comment)(sitting EOB)  OT Visit Diagnosis: Pain Pain - part of body: (neck)                Time: 1610-9604 OT Time Calculation (min): 11  min Charges:  OT General Charges $OT Visit: 1 Visit OT Evaluation $OT Eval Low Complexity: 1 Low G-Codes:     Luke Williams A. Brett Albino, M.S., OTR/L Acute Rehab Department: [phone]  Gaye Alken 11/17/2017, 4:13 PM

## 2017-11-17 NOTE — Anesthesia Procedure Notes (Signed)
Procedure Name: Intubation Date/Time: 11/17/2017 10:41 AM Performed by: Lovie Cholock, Mekayla Soman K, CRNA Pre-anesthesia Checklist: Patient identified, Emergency Drugs available, Suction available and Patient being monitored Patient Re-evaluated:Patient Re-evaluated prior to induction Oxygen Delivery Method: Circle System Utilized Preoxygenation: Pre-oxygenation with 100% oxygen Induction Type: IV induction Ventilation: Mask ventilation without difficulty and Oral airway inserted - appropriate to patient size Laryngoscope Size: Glidescope and 4 Grade View: Grade I Tube type: Oral Tube size: 7.5 mm Number of attempts: 1 Airway Equipment and Method: Stylet,  Oral airway and Video-laryngoscopy Placement Confirmation: ETT inserted through vocal cords under direct vision,  positive ETCO2 and breath sounds checked- equal and bilateral Secured at: 22 cm Tube secured with: Tape Dental Injury: Teeth and Oropharynx as per pre-operative assessment  Difficulty Due To: Difficulty was anticipated and Difficult Airway- due to reduced neck mobility

## 2017-11-17 NOTE — Discharge Summary (Signed)
Physician Discharge Summary  Patient ID: Luke Williams MRN: [id number] DOB/AGE: 1980-11-13 37 y.o.  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(HCC)   Discharged Condition: good  Hospital Course: Luke Williams was admitted for surgery with dx pseudoarthrosis C6-7. Following uncomplicated posterior cervical fusion C6-7, he recovered nicely and transferred to [facility] for nursing care and therapies. He is mobilizing well.   Consults: None  Significant Diagnostic Studies: radiology: X-Ray: intra-op  Treatments: surgery: CERVICAL 6- CERVICAL 7 POSTERIOR CERVICAL FUSION (N/A) - CERVICAL 6- CERVICAL 7 POSTERIOR CERVICAL FUSION with lateral mass screws and posterolateral arthrodesis C 6 - C 7 levels    Discharge Exam: Blood pressure (!) 144/89, pulse 85, temperature (!) 97.5 F (36.4 C), temperature source Oral, resp. rate 18, SpO2 100 %. Sitting up in bed. Reports posterior cervical/shoulder pain, as expected. Incision without erythema, swelling, or drainage beneath honeycomb and Dermabond. Good strength BUE. Pt requests d/c to home, stating he understands pain expectations, and knows to begin pain medication taper in 2-3 days.    Disposition: Discharge to home. He verbalizes understanding of d/c instructions. Oxycodone 10mg  and Robaxin 500mg  will be eRx'ed to pt's pharmacy by Dr. Venetia MaxonStern. Pt agrees to call office to schedule 3-4 wk f/u visit.      Allergies as of 11/17/2017   No Known Allergies     Medication List    TAKE these medications   albuterol  108 (90 Base) MCG/ACT inhaler Commonly known as:  PROAIR HFA Inhale 2 puffs into the lungs every 6 (six) hours as needed for wheezing or shortness of breath.   cetirizine 10 MG tablet Commonly known as:  ZYRTEC Take 1 tablet (10 mg total) by mouth daily.   fluticasone 44 MCG/ACT inhaler Commonly known as:  FLOVENT HFA Inhale 2 puffs into the lungs 2 (two) times daily.   omeprazole 20 MG capsule Commonly known as:  PRILOSEC Take 1 capsule (20 mg total) by mouth daily.   oxyCODONE-acetaminophen 10-325 MG tablet Commonly known as:  PERCOCET Take 1 tablet by mouth every 6 (six) hours as needed for pain.   tiZANidine 4 MG tablet Commonly known as:  ZANAFLEX Take 4 mg by mouth 2 (two) times daily as needed for muscle spasms.        Signed: Dorian HeckleSTERN,Rohnan D, MD 11/17/2017, 5:11 PM

## 2017-11-17 NOTE — Progress Notes (Signed)
Awake, alert, conversant.  MAEW with full bilateral D/B/T/HI strength.  Doing well.  

## 2017-11-17 NOTE — Discharge Instructions (Signed)

## 2017-11-17 NOTE — Anesthesia Preprocedure Evaluation (Addendum)
Anesthesia Evaluation  Patient identified by MRN, date of birth, ID band Patient awake    Reviewed: Allergy & Precautions, NPO status , Patient's Chart, lab work & pertinent test results  Airway Mallampati: II  TM Distance: >3 FB Neck ROM: Limited    Dental  (+) Teeth Intact, Dental Advisory Given   Pulmonary asthma , Current Smoker,    breath sounds clear to auscultation       Cardiovascular  Rhythm:Regular Rate:Normal     Neuro/Psych    GI/Hepatic GERD  Controlled,  Endo/Other  negative endocrine ROS  Renal/GU negative Renal ROS  negative genitourinary   Musculoskeletal   Abdominal   Peds negative pediatric ROS (+)  Hematology negative hematology ROS (+)   Anesthesia Other Findings   Reproductive/Obstetrics negative OB ROS                            Anesthesia Physical Anesthesia Plan  ASA: II  Anesthesia Plan: General   Post-op Pain Management:    Induction: Intravenous  PONV Risk Score and Plan: Ondansetron and Dexamethasone  Airway Management Planned: Oral ETT and Video Laryngoscope Planned  Additional Equipment:   Intra-op Plan:   Post-operative Plan: Extubation in OR  Informed Consent: I have reviewed the patients History and Physical, chart, labs and discussed the procedure including the risks, benefits and alternatives for the proposed anesthesia with the patient or authorized representative who has indicated his/her understanding and acceptance.   Dental advisory given  Plan Discussed with: CRNA and Anesthesiologist  Anesthesia Plan Comments:        Anesthesia Quick Evaluation

## 2017-11-17 NOTE — H&P (Signed)
Patient ID:   [id number] Patient: Luke Williams  Date of Birth: Aug 25, 1980 Visit Type: Office Visit   Date: 09/29/2017 01:30 PM Provider: Danae OrleansJoseph D. Luke MaxonStern MD   This 37 year old male presents for pain.  HISTORY OF PRESENT ILLNESS:  1.  pain  The patient returns today with a flare up of neck pain.  He says this began 1-1/2 weeks ago when he was Lain papers and developed severe pain into his shoulder blades.  He says that his pain is worse but is not changed significantly from his previous pain.  On examination he has negative Tinel's and Phalen's sign and negative Spurling sign.  He is quite uncomfortable with moving his neck.  I believe that he still has a pseudoarthrosis at C6-7 and that this is currently symptomatic because of exacerbation of pain with the laying of the papers.  I have recommended a Medrol Dosepak and refilled tizanidine and oxycodone.         Past Systemic History Patient reported no relevant past medical/surgical history.  Medical/Surgical/Interim History Reviewed, no change.  Last detailed document date:11/06/2015.     PAST MEDICAL HISTORY, SURGICAL HISTORY, FAMILY HISTORY, SOCIAL HISTORY AND REVIEW OF SYSTEMS I have reviewed the patient's past medical, surgical, family and social history as well as the comprehensive review of systems as included on the [facility] history form dated 07/27/2017, which I have signed.  Family History:  Reviewed, no changes.  Last detailed document date:11/06/2015.   Social History: Reviewed, no changes. Last detailed document date: 11/06/2015.    MEDICATIONS: (added, continued or stopped this visit) Started Medication Directions Instruction Stopped   albuterol - nebulizer   INHALATION use as needed for asthma attacks    09/29/2017 Medrol (Pak) 4 mg tablets in a dose pack Take as directed    09/29/2017 Percocet 10 mg-325 mg tablet take 1 tablet by oral route  every 6 hours as needed     09/29/2017 tizanidine 4 mg tablet take 1 tablet by oral route  1 - 3 times every day       ALLERGIES: Ingredient Reaction Medication Name Comment  NO KNOWN ALLERGIES     No known allergies. Reviewed, no changes.    PHYSICAL EXAM:   Vitals Date Temp F BP Pulse Ht In Wt Lb BMI BSA Pain Score  09/29/2017  144/92 96 70 194 27.84  7/10      IMPRESSION:   Patient is going to see how he does with steroid taper and pain medication and rest.  He does want to go ahead with posterior cervical fusion at C6-7 for pseudoarthrosis.  PLAN:  We will set up surgery in the near future.  Risks and benefits were discussed in detail with the patient and he wishes to proceed.  Orders: Diagnostic Procedures: Assessment Procedure  M54.12 Cervical Spine- AP/Lat  Instruction(s)/Education: Assessment Instruction   Tobacco cessation counseling  R03.0 Hypertension education  Z68.27 Dietary management education, guidance, and counseling   Completed Orders (this encounter) Order Details Reason Side Interpretation Result Initial Treatment Date Region  Tobacco cessation counseling         Hypertension education Continue to monitor blood pressure. If blood pressure remains elevated contact primary care provider        Dietary management education, guidance, and counseling patient encouraged to eat a well balanced diet         Assessment/Plan   # Detail Type Description   1. Assessment Pseudoarthrosis of cervical spine, initial  encounter (S12.9XXA).       2. Assessment Cervicalgia (M54.2).       3. Assessment Radiculopathy, cervical region (M54.12).       4. Assessment Elevated blood-pressure reading, w/o diagnosis of htn (R03.0).       5. Assessment Body mass index (BMI) 27.0-27.9, adult (Z61.09).   Plan Orders Today's instructions / counseling include(s) Dietary management education, guidance, and counseling.         Pain Management Plan Pain Scale: 7/10. Method: Numeric Pain Intensity  Scale. Location: back. Onset: 08/07/2015. Duration: varies. Quality: discomforting. Pain management follow-up plan of care: Patient is taking OTC pain relievers for relief.Marland Kitchen     MEDICATIONS PRESCRIBED TODAY    Rx Quantity Refills  TIZANIDINE HCL 4 mg  60 1  PERCOCET 10 mg-325 mg  60 0  MEDROL 4 mg  1 0            Provider:  Venetia Maxon Williams, Luke Williams 10/03/2017 3:43 PM  Dictation edited by: Luke Orleans. Luke Maxon    CC Providers: PCP  None   Luke Williams  [address] [place], [place] [address]-              Electronically signed by Luke Orleans Luke Maxon MD on 10/03/2017 03:44 PM

## 2017-11-17 NOTE — Anesthesia Postprocedure Evaluation (Signed)
Anesthesia Post Note  Patient: Luke Williams  Procedure(s) Performed: CERVICAL SIX- CERVICAL SEVEN POSTERIOR CERVICAL FUSION (N/A Spine Cervical)     Patient location during evaluation: PACU Anesthesia Type: General Level of consciousness: awake and alert Pain management: pain level controlled Vital Signs Assessment: post-procedure vital signs reviewed and stable Respiratory status: spontaneous breathing, nonlabored ventilation, respiratory function stable and patient connected to nasal cannula oxygen Cardiovascular status: blood pressure returned to baseline and stable Postop Assessment: no apparent nausea or vomiting Anesthetic complications: no    Last Vitals:  Vitals:   11/17/17 1404 11/17/17 1634  BP: 129/86 (!) 144/89  Pulse: 70 85  Resp: 19 18  Temp: (!) 36.4 C   SpO2: 98% 100%    Last Pain:  Vitals:   11/17/17 1444  TempSrc:   PainSc: 8                  Denea Cheaney COKER

## 2017-11-17 NOTE — Progress Notes (Signed)
Discharged instructions/education/Rx/AVS given to patient with family at bedside and they all verbalized understanding. MAE well, ambulating independently in hallway. Swallowing well with no complaints. No drainage, no redness and swelling noted on incision site. Pain is mild to moderate controlled well by PRN pain medication. Discharged via wheelchair.

## 2017-11-17 NOTE — Progress Notes (Addendum)
Patient ID: Luke Williams, male   DOB: Nov 08, 1980, 37 y.o.   MRN: [id number] Sitting up in bed. Reports posterior cervical/shoulder pain, as expected. Incision without erythema, swelling, or drainage beneath honeycomb and Dermabond. Good strength BUE. Pt requests d/c to home, stating he understands pain expectations, and knows to begin pain medication taper in 2-3 days. He verbalizes understanding of d/c instructions. Oxycodone 10mg  and Robaxin 500mg  will be eRx'ed to pt's pharmacy by Dr. Venetia MaxonStern. Pt agrees to call office to set up 3-4 wk post-op visit.  Patient is doing well.  Discharge home.  Prescriptions for Percocet and Robaxin have been E Rxed.

## 2017-11-17 NOTE — Evaluation (Signed)
Physical Therapy Evaluation Patient Details Name: Luke Williams MRN: [id number] DOB: Feb 13, 1981 Today's Date: 11/17/2017   History of Present Illness  Pt is a 37 y.o. male s/p C6-7 Posterior cervical fusion. No pertinent PMHx in chart.   Clinical Impression  Patient evaluated by Physical Therapy with no further acute PT needs identified. All education has been completed and the patient has no further questions. Pt overall supervision for gait and stair navigation. No LOB noted. Reviewed cervical precautions and walking program with pt and family. Pt very eager to go home today.  See below for any follow-up Physical Therapy or equipment needs. PT is signing off. Thank you for this referral. If needs change, please reconsult.      Follow Up Recommendations No PT follow up    Equipment Recommendations  None recommended by PT    Recommendations for Other Services       Precautions / Restrictions Precautions Precautions: Cervical Precaution Booklet Issued: Yes (comment) Precaution Comments: Pt unable to recall precautions, so reviewed precautions with pt.  Required Braces or Orthoses: Cervical Brace Cervical Brace: Hard collar Restrictions Weight Bearing Restrictions: No      Mobility  Bed Mobility Overal bed mobility: Needs Assistance Bed Mobility: Supine to Sit;Sit to Supine     Supine to sit: Supervision;HOB elevated Sit to supine: Supervision   General bed mobility comments: Educated pt on log roll technique from flat bed.  Transfers Overall transfer level: Needs assistance Equipment used: None Transfers: Sit to/from Stand Sit to Stand: Supervision         General transfer comment: for safety, no physical assist needed  Ambulation/Gait Ambulation/Gait assistance: Supervision Ambulation Distance (Feet): 125 Feet Assistive device: None Gait Pattern/deviations: Step-through pattern;Decreased stride length Gait velocity: Decreased    General Gait Details:  Cautious gait, however, no LOB noted. Educated about generalized walking program to perform at home.   Stairs Stairs: Yes Stairs assistance: Supervision Stair Management: One rail Left;Alternating pattern;Forwards Number of Stairs: 6 General stair comments: Supervision for safety. No LOB noted during stair navigation.   Wheelchair Mobility    Modified Rankin (Stroke Patients Only)       Balance Overall balance assessment: Mild deficits observed, not formally tested                                           Pertinent Vitals/Pain Pain Assessment: Faces Faces Pain Scale: Hurts even more Pain Location: neck Pain Descriptors / Indicators: Aching Pain Intervention(s): Limited activity within patient's tolerance;Monitored during session;Repositioned    Home Living Family/patient expects to be discharged to:: Private residence Living Arrangements: Spouse/significant other Available Help at Discharge: Family;Available 24 hours/day Type of Home: Mobile home Home Access: Stairs to enter Entrance Stairs-Rails: Lawyer of Steps: 4 Home Layout: One level Home Equipment: None      Prior Function Level of Independence: Independent               Hand Dominance        Extremity/Trunk Assessment   Upper Extremity Assessment Upper Extremity Assessment: Defer to OT evaluation    Lower Extremity Assessment Lower Extremity Assessment: Overall WFL for tasks assessed    Cervical / Trunk Assessment Cervical / Trunk Assessment: Other exceptions Cervical / Trunk Exceptions: s/p cervical fusion  Communication   Communication: No difficulties  Cognition Arousal/Alertness: Awake/alert Behavior During Therapy: WFL for tasks assessed/performed  Overall Cognitive Status: Within Functional Limits for tasks assessed                                        General Comments      Exercises     Assessment/Plan    PT  Assessment Patent does not need any further PT services  PT Problem List         PT Treatment Interventions      PT Goals (Current goals can be found in the Care Plan section)  Acute Rehab PT Goals Patient Stated Goal: home today PT Goal Formulation: With patient Time For Goal Achievement: 11/17/17 Potential to Achieve Goals: Good    Frequency     Barriers to discharge        Co-evaluation               AM-PAC PT "6 Clicks" Daily Activity  Outcome Measure Difficulty turning over in bed (including adjusting bedclothes, sheets and blankets)?: None Difficulty moving from lying on back to sitting on the side of the bed? : None Difficulty sitting down on and standing up from a chair with arms (e.g., wheelchair, bedside commode, etc,.)?: None Help needed moving to and from a bed to chair (including a wheelchair)?: None Help needed walking in hospital room?: None Help needed climbing 3-5 steps with a railing? : None 6 Click Score: 24    End of Session Equipment Utilized During Treatment: Gait belt;Cervical collar Activity Tolerance: Patient tolerated treatment well Patient left: in bed;with call bell/phone within reach;with family/visitor present Nurse Communication: Mobility status PT Visit Diagnosis: Other abnormalities of gait and mobility (R26.89);Pain Pain - part of body: (neck )    Time: [id number] PT Time Calculation (min) (ACUTE ONLY): 12 min   Charges:   PT Evaluation $PT Eval Low Complexity: 1 Low     PT G Codes:        Gladys DammeBrittany Kjuan Seipp, PT, DPT  Acute Rehabilitation Services  Pager: [phone]   Lehman PromBrittany S Jaryiah Mehlman 11/17/2017, 5:16 PM

## 2017-11-17 NOTE — Telephone Encounter (Signed)
Per DPR, left detail message of Kate Clark's comments for patient to call back 

## 2017-11-17 NOTE — Op Note (Addendum)
11/17/2017  12:27 PM  PATIENT:  Luke Williams  37 y.o. male  PRE-OPERATIVE DIAGNOSIS:  PSEUDOARTHROSIS OF CERVICAL SPINE  C 67 level with cervicalgia  POST-OPERATIVE DIAGNOSIS: PSEUDOARTHROSIS OF CERVICAL SPINE  C 67 level with cervicalgia  PROCEDURE:  Procedure(s) with comments: CERVICAL 6- CERVICAL 7 POSTERIOR CERVICAL FUSION (N/A) - CERVICAL 6- CERVICAL 7 POSTERIOR CERVICAL FUSION with lateral mass screws and posterolateral arthrodesis C 6 - C 7 levels  SURGEON:  Surgeon(s) and Role:    Maeola Harman* Janissa Bertram, Brendin, MD - Primary    * Julio SicksPool, Henry, MD - Assisting  PHYSICIAN ASSISTANT:   ASSISTANTS: Pool, MD   ANESTHESIA:   general  EBL:  Minimal  BLOOD ADMINISTERED:none  DRAINS: none   LOCAL MEDICATIONS USED:  MARCAINE    and LIDOCAINE   SPECIMEN:  No Specimen  DISPOSITION OF SPECIMEN:  N/A  COUNTS:  YES  TOURNIQUET:  * No tourniquets in log *  DICTATION: Patient is 37 year old man withprior C 67 ACDF with pseudoarthrosis and cervicalgia.  It was elected to take the patient to surgery for posterior cervical fusion. He is a smoker.  PROCEDURE: Patient was brought to the OR and following smooth and uncomplicated induction of GETA using Glide Scope, patient was placed in 3 pin fixation and rolled into a prone position on the OR table in the cervical collar.  Shoulders were taped to facilitate Xray visualization. Posterior neck was shaved with clippers, prepped and draped in the usual fashion with betadine scrub followed by Duraprep.  Area of planned incision was infiltrated with local lidocaine.  Incision was made from C 5 - C 7 and carried through the avascular midline plane to expose these lavels and their lateral masses.  There was pseudoarthrosis at the C 6 C 7 level. Correct level was confirmed with a towel clip at C 5 level .  Lateral mass screws (3.5 x 12 mm) were placed from C6 to C7 bilaterally according to standard landmarks and their positioning was confirmed with fluoroscopy.   The facet joints and laminae were decorticated.  Screws and rods were locked down in situ.  Facet joint complex at C 6 C 7 was decorticated with high speed drill and packed with extra extra small BMP and 5 cc Attrax. Hemostasis was assured and wound was irrigated. Wound was closed with 0, 2-0, and 3-0 vicryl sutures. Sterile occlusive dressing was placed.  Patient was taken out of pins and turned back onto the OR table.  Patient was extubated in the operating room and taken to recovery in stable and satisfactory condition.  Counts were correct at the end of the case.  PLAN OF CARE: Admit to inpatient   PATIENT DISPOSITION:  PACU - hemodynamically stable.   Delay start of Pharmacological VTE agent (>24hrs) due to surgical blood loss or risk of bleeding: yes

## 2017-11-17 NOTE — Brief Op Note (Addendum)
11/17/2017  12:27 PM  PATIENT:  Luke Williams  36 y.o. male  PRE-OPERATIVE DIAGNOSIS:  PSEUDOARTHROSIS OF CERVICAL SPINE  C 67 level with cervicalgia  POST-OPERATIVE DIAGNOSIS: PSEUDOARTHROSIS OF CERVICAL SPINE  C 67 level with cervicalgia  PROCEDURE:  Procedure(s) with comments: CERVICAL 6- CERVICAL 7 POSTERIOR CERVICAL FUSION (N/A) - CERVICAL 6- CERVICAL 7 POSTERIOR CERVICAL FUSION with lateral mass screws and posterolateral arthrodesis C 6 - C 7 levels  SURGEON:  Surgeon(s) and Role:    * Saara Kijowski, Dreden, MD - Primary    * Pool, Henry, MD - Assisting  PHYSICIAN ASSISTANT:   ASSISTANTS: Pool, MD   ANESTHESIA:   general  EBL:  Minimal  BLOOD ADMINISTERED:none  DRAINS: none   LOCAL MEDICATIONS USED:  MARCAINE    and LIDOCAINE   SPECIMEN:  No Specimen  DISPOSITION OF SPECIMEN:  N/A  COUNTS:  YES  TOURNIQUET:  * No tourniquets in log *  DICTATION: Patient is 36 year old man withprior C 67 ACDF with pseudoarthrosis and cervicalgia.  It was elected to take the patient to surgery for posterior cervical fusion. He is a smoker.  PROCEDURE: Patient was brought to the OR and following smooth and uncomplicated induction of GETA using Glide Scope, patient was placed in 3 pin fixation and rolled into a prone position on the OR table in the cervical collar.  Shoulders were taped to facilitate Xray visualization. Posterior neck was shaved with clippers, prepped and draped in the usual fashion with betadine scrub followed by Duraprep.  Area of planned incision was infiltrated with local lidocaine.  Incision was made from C 5 - C 7 and carried through the avascular midline plane to expose these lavels and their lateral masses.  There was pseudoarthrosis at the C 6 C 7 level. Correct level was confirmed with a towel clip at C 5 level .  Lateral mass screws (3.5 x 12 mm) were placed from C6 to C7 bilaterally according to standard landmarks and their positioning was confirmed with fluoroscopy.   The facet joints and laminae were decorticated.  Screws and rods were locked down in situ.  Facet joint complex at C 6 C 7 was decorticated with high speed drill and packed with extra extra small BMP and 5 cc Attrax. Hemostasis was assured and wound was irrigated. Wound was closed with 0, 2-0, and 3-0 vicryl sutures. Sterile occlusive dressing was placed.  Patient was taken out of pins and turned back onto the OR table.  Patient was extubated in the operating room and taken to recovery in stable and satisfactory condition.  Counts were correct at the end of the case.  PLAN OF CARE: Admit to inpatient   PATIENT DISPOSITION:  PACU - hemodynamically stable.   Delay start of Pharmacological VTE agent (>24hrs) due to surgical blood loss or risk of bleeding: yes  

## 2017-11-17 NOTE — Telephone Encounter (Signed)
How often is he using his albuterol inhaler? It looks like from the chart that his Flovent was too expensive? Is this the case? I was waiting for a phone call from him if this was the case. I'll send another inhaler for daily use for asthma, only use albuterol as needed.  Let me know.

## 2017-11-18 ENCOUNTER — Encounter (HOSPITAL_COMMUNITY): Payer: Self-pay | Admitting: Neurosurgery

## 2017-11-20 NOTE — Telephone Encounter (Signed)
Per DPR, left detail message of Kate Clark's comments for patient to call back 

## 2018-01-06 DIAGNOSIS — M4802 Spinal stenosis, cervical region: Secondary | ICD-10-CM | POA: Diagnosis not present

## 2018-02-24 ENCOUNTER — Telehealth: Payer: Self-pay | Admitting: Primary Care

## 2018-02-24 DIAGNOSIS — J45909 Unspecified asthma, uncomplicated: Secondary | ICD-10-CM

## 2018-02-24 DIAGNOSIS — J4540 Moderate persistent asthma, uncomplicated: Secondary | ICD-10-CM

## 2018-02-24 MED ORDER — BECLOMETHASONE DIPROP HFA 80 MCG/ACT IN AERB
2.0000 | INHALATION_SPRAY | Freq: Two times a day (BID) | RESPIRATORY_TRACT | 1 refills | Status: DC
Start: 2018-02-24 — End: 2018-05-17

## 2018-02-24 MED ORDER — ALBUTEROL SULFATE HFA 108 (90 BASE) MCG/ACT IN AERS: 2.0000 | INHALATION_SPRAY | Freq: Four times a day (QID) | RESPIRATORY_TRACT | 0 refills | Status: DC | PRN

## 2018-02-24 NOTE — Telephone Encounter (Signed)
Received an update from patient's mother today stating that he could not afford Flovent inhaler that was provided to him in April 2019 so he's been using his albuterol daily. Recent refill for albuterol was denied for some reason.  Discussed with mother today that we will send in Qvar to use daily to help with symptoms, and also will refill albuterol. Discussed for the patient to call if the Qvar inhaler is too expensive, mother verbalized understanding. We will need to see him back in the office in 1 month for re-evaluation. Mother verbalized understanding and will pass along all of the information. She is on his DPR.

## 2018-04-02 ENCOUNTER — Ambulatory Visit: Payer: BLUE CROSS/BLUE SHIELD | Admitting: Primary Care

## 2018-05-14 ENCOUNTER — Ambulatory Visit: Payer: BLUE CROSS/BLUE SHIELD | Admitting: Primary Care

## 2018-05-17 ENCOUNTER — Telehealth: Payer: Self-pay | Admitting: Primary Care

## 2018-05-17 DIAGNOSIS — J4540 Moderate persistent asthma, uncomplicated: Secondary | ICD-10-CM

## 2018-05-17 MED ORDER — BECLOMETHASONE DIPROP HFA 80 MCG/ACT IN AERB: 2.0000 | INHALATION_SPRAY | Freq: Two times a day (BID) | RESPIRATORY_TRACT | 0 refills | Status: DC

## 2018-05-17 NOTE — Telephone Encounter (Signed)
albuterol (PROAIR HFA)  last prescribed on 02/25/2018   beclomethasone (QVAR REDIHALER) 80 MCG/ACT inhaler  Last prescribed on 02/24/2018  Last office visit on 10/05/2017

## 2018-05-17 NOTE — Telephone Encounter (Signed)
Pt's mother states pt needs refill on inhaler. States his current one will not last him until next appointment.

## 2018-05-17 NOTE — Telephone Encounter (Signed)
Noted, spoken with mother today.  Refill for Qvar inhaler sent to pharmacy.

## 2018-06-10 ENCOUNTER — Ambulatory Visit: Payer: BLUE CROSS/BLUE SHIELD | Admitting: Primary Care

## 2018-06-10 DIAGNOSIS — Z0289 Encounter for other administrative examinations: Secondary | ICD-10-CM

## 2018-08-06 ENCOUNTER — Emergency Department (HOSPITAL_COMMUNITY)
Admission: EM | Admit: 2018-08-06 | Discharge: 2018-08-06 | Disposition: A | Discharge status: Home / Self Care | Payer: BLUE CROSS/BLUE SHIELD | Attending: Emergency Medicine | Admitting: Emergency Medicine

## 2018-08-06 ENCOUNTER — Encounter (HOSPITAL_COMMUNITY): Payer: Self-pay | Admitting: Emergency Medicine

## 2018-08-06 ENCOUNTER — Emergency Department (HOSPITAL_COMMUNITY): Payer: BLUE CROSS/BLUE SHIELD

## 2018-08-06 DIAGNOSIS — J4521 Mild intermittent asthma with (acute) exacerbation: Secondary | ICD-10-CM | POA: Insufficient documentation

## 2018-08-06 DIAGNOSIS — F1510 Other stimulant abuse, uncomplicated: Secondary | ICD-10-CM | POA: Diagnosis not present

## 2018-08-06 DIAGNOSIS — R0789 Other chest pain: Secondary | ICD-10-CM | POA: Insufficient documentation

## 2018-08-06 DIAGNOSIS — R079 Chest pain, unspecified: Secondary | ICD-10-CM | POA: Diagnosis not present

## 2018-08-06 DIAGNOSIS — F17210 Nicotine dependence, cigarettes, uncomplicated: Secondary | ICD-10-CM | POA: Diagnosis not present

## 2018-08-06 DIAGNOSIS — Z720 Tobacco use: Secondary | ICD-10-CM | POA: Diagnosis not present

## 2018-08-06 DIAGNOSIS — R0602 Shortness of breath: Secondary | ICD-10-CM | POA: Diagnosis not present

## 2018-08-06 LAB — CBC
HCT: 40.3 % (ref 39.0–52.0)
Hemoglobin: 12.6 g/dL — ABNORMAL LOW (ref 13.0–17.0)
MCH: 23.4 pg — ABNORMAL LOW (ref 26.0–34.0)
MCHC: 31.3 g/dL (ref 30.0–36.0)
MCV: 74.9 fL — AB (ref 80.0–100.0)
Platelets: 312 10*3/uL (ref 150–400)
RBC: 5.38 MIL/uL (ref 4.22–5.81)
RDW: 18.6 % — ABNORMAL HIGH (ref 11.5–15.5)
WBC: 8.4 10*3/uL (ref 4.0–10.5)
nRBC: 0 % (ref 0.0–0.2)

## 2018-08-06 LAB — BASIC METABOLIC PANEL
Anion gap: 15 (ref 5–15)
BUN: 9 mg/dL (ref 6–20)
CO2: 21 mmol/L — ABNORMAL LOW (ref 22–32)
Calcium: 10.3 mg/dL (ref 8.9–10.3)
Chloride: 94 mmol/L — ABNORMAL LOW (ref 98–111)
Creatinine, Ser: 1.03 mg/dL (ref 0.61–1.24)
GFR calc Af Amer: >60 mL/min (ref >60)
GFR calc non Af Amer: >60 mL/min (ref >60)
Glucose, Bld: 98 mg/dL (ref 70–99)
Potassium: 4.5 mmol/L (ref 3.5–5.1)
Sodium: 130 mmol/L — ABNORMAL LOW (ref 135–145)

## 2018-08-06 LAB — I-STAT TROPONIN, ED: Troponin i, poc: 0.01 ng/mL (ref 0.00–0.08)

## 2018-08-06 MED ORDER — ALUM & MAG HYDROXIDE-SIMETH 200-200-20 MG/5ML PO SUSP
30.0000 mL | Freq: Once | ORAL | Status: AC
  Administered 2018-08-06: 30 mL via ORAL
  Filled 2018-08-06: qty 30

## 2018-08-06 MED ORDER — SODIUM CHLORIDE 0.9% FLUSH: 3.0000 mL | Freq: Once | INTRAVENOUS | Status: DC

## 2018-08-06 MED ORDER — METHYLPREDNISOLONE SODIUM SUCC 125 MG IJ SOLR
125.0000 mg | Freq: Once | INTRAMUSCULAR | Status: AC
  Administered 2018-08-06: 125 mg via INTRAVENOUS
  Filled 2018-08-06: qty 2

## 2018-08-06 MED ORDER — MORPHINE SULFATE (PF) 4 MG/ML IV SOLN
4.0000 mg | Freq: Once | INTRAVENOUS | Status: AC
  Administered 2018-08-06: 4 mg via INTRAVENOUS
  Filled 2018-08-06: qty 1

## 2018-08-06 MED ORDER — LIDOCAINE VISCOUS HCL 2 % MT SOLN
15.0000 mL | Freq: Once | OROMUCOSAL | Status: AC
  Administered 2018-08-06: 15 mL via ORAL
  Filled 2018-08-06: qty 15

## 2018-08-06 MED ORDER — IPRATROPIUM-ALBUTEROL 0.5-2.5 (3) MG/3ML IN SOLN
3.0000 mL | Freq: Once | RESPIRATORY_TRACT | Status: AC
  Administered 2018-08-06: 3 mL via RESPIRATORY_TRACT
  Filled 2018-08-06: qty 3

## 2018-08-06 MED ORDER — ONDANSETRON HCL 4 MG/2ML IJ SOLN
4.0000 mg | Freq: Once | INTRAMUSCULAR | Status: AC
  Administered 2018-08-06: 4 mg via INTRAVENOUS
  Filled 2018-08-06: qty 2

## 2018-08-06 MED ORDER — LORAZEPAM 2 MG/ML IJ SOLN
1.0000 mg | Freq: Once | INTRAMUSCULAR | Status: AC
  Administered 2018-08-06: 1 mg via INTRAVENOUS
  Filled 2018-08-06: qty 1

## 2018-08-06 MED ORDER — SODIUM CHLORIDE 0.9 % IV BOLUS
1000.0000 mL | Freq: Once | INTRAVENOUS | Status: AC
  Administered 2018-08-06: 1000 mL via INTRAVENOUS

## 2018-08-06 MED ORDER — PREDNISONE 10 MG (21) PO TBPK: ORAL_TABLET | ORAL | 0 refills | Status: DC

## 2018-08-06 NOTE — ED Triage Notes (Signed)
Pt with shortness of breath and substernal chest pain. He reports being clean but then used meth on Sunday. Reports it feels like heart burn in the chest and neck. A/O at triage. Denies hx.

## 2018-08-06 NOTE — Discharge Instructions (Addendum)
Do not use meth.  Try to stop smoking.

## 2018-08-06 NOTE — ED Provider Notes (Signed)
[facility] EMERGENCY DEPARTMENT Provider Note   CSN: [id number] Arrival date & time: 08/06/18  1119    History   Chief Complaint Chief Complaint  Patient presents with  . Shortness of Breath  . Chest Pain    HPI Luke Williams is a 38 y.o. male.     Pt presents to the ED today with SOB and CP.  He used meth on Sunday, Feb. 23rd and has not felt well since.  He also has a hx of asthma and continues to smoke.  He said he's not been able to eat or drink much since he used the met.  Pt feels like he has heart burn.     Past Medical History:  Diagnosis Date  . Asthma   . Chickenpox   . GERD (gastroesophageal reflux disease)   . Seasonal allergies     Patient Active Problem List   Diagnosis Date Noted  . Pseudoarthrosis of cervical spine ([place]) 11/17/2017  . Esophageal reflux 08/18/2015  . Pain of left lower extremity 08/18/2015  . Neck pain 08/18/2015  . Asthma 08/18/2015    Past Surgical History:  Procedure Laterality Date  . LEG SURGERY  10 or 11 years ago  . NECK SURGERY     anterior cervical  . POSTERIOR CERVICAL FUSION/FORAMINOTOMY N/A 11/17/2017   Procedure: CERVICAL SIX- CERVICAL SEVEN POSTERIOR CERVICAL FUSION;  Surgeon: Erline Levine, MD;  Location: [facility];  Service: Neurosurgery;  Laterality: N/A;  CERVICAL 6- CERVICAL 7 POSTERIOR CERVICAL FUSION        Home Medications    Prior to Admission medications   Medication Sig Start Date End Date Taking? Authorizing Provider  albuterol (PROAIR HFA) 108 (90 Base) MCG/ACT inhaler Inhale 2 puffs into the lungs every 6 (six) hours as needed for wheezing or shortness of breath. Patient not taking: Reported on 08/06/2018 02/24/18   Pleas Koch, NP  beclomethasone (QVAR REDIHALER) 80 MCG/ACT inhaler Inhale 2 puffs into the lungs 2 (two) times daily. Patient not taking: Reported on 08/06/2018 05/17/18   Pleas Koch, NP  cetirizine (ZYRTEC) 10 MG tablet Take 1 tablet (10 mg total) by  mouth daily. Patient not taking: Reported on 08/06/2018 10/05/17   Pleas Koch, NP  fluticasone (FLOVENT HFA) 44 MCG/ACT inhaler Inhale 2 puffs into the lungs 2 (two) times daily. Patient not taking: Reported on 11/03/2017 10/05/17   Pleas Koch, NP  omeprazole (PRILOSEC) 20 MG capsule Take 1 capsule (20 mg total) by mouth daily. Patient not taking: Reported on 08/06/2018 10/05/17   Pleas Koch, NP  predniSONE (STERAPRED UNI-PAK 21 TAB) 10 MG (21) TBPK tablet Take 6 tabs for 2 days, then 5 for 2 days, then 4 for 2 days, then 3 for 2 days, 2 for 2 days, then 1 for 2 days 08/06/18   Isla Pence, MD    Family History Family History  Problem Relation Age of Onset  . Arthritis Mother   . Hypertension Mother   . Alcohol abuse Father   . Hyperlipidemia Father   . Hypertension Father   . Arthritis Maternal Grandmother   . Stroke Maternal Grandmother   . Hypertension Maternal Grandmother   . Colon cancer Maternal Grandfather   . Stroke Maternal Grandfather     Social History Social History   Tobacco Use  . Smoking status: Current Every Day Smoker    Packs/day: 1.00    Types: Cigarettes  . Smokeless tobacco: Never Used  Substance Use  Topics  . Alcohol use: Yes    Alcohol/week: 0.0 standard drinks    Comment: case of beer a week  . Drug use: Yes    Types: Methamphetamines, Marijuana     Allergies   Patient has no known allergies.   Review of Systems Review of Systems  Respiratory: Positive for cough, shortness of breath and wheezing.   Cardiovascular: Positive for chest pain.  All other systems reviewed and are negative.    Physical Exam Updated Vital Signs BP (!) 141/83   Pulse (!) 104   Temp 97.7 F (36.5 C) (Oral)   Resp 14   SpO2 98%   Physical Exam Vitals signs and nursing note reviewed.  Constitutional:      Appearance: He is well-developed.  HENT:     Head: Normocephalic and atraumatic.     Mouth/Throat:     Mouth: Mucous membranes  are moist.     Pharynx: Oropharynx is clear.  Eyes:     Extraocular Movements: Extraocular movements intact.     Pupils: Pupils are equal, round, and reactive to light.  Neck:     Musculoskeletal: Normal range of motion and neck supple.  Cardiovascular:     Rate and Rhythm: Regular rhythm. Tachycardia present.  Pulmonary:     Effort: Pulmonary effort is normal.     Breath sounds: Wheezing present.  Abdominal:     General: Bowel sounds are normal.     Palpations: Abdomen is soft.  Musculoskeletal: Normal range of motion.  Skin:    General: Skin is warm and dry.     Capillary Refill: Capillary refill takes less than 2 seconds.  Neurological:     General: No focal deficit present.     Mental Status: He is alert and oriented to person, place, and time.  Psychiatric:        Mood and Affect: Mood is anxious.        Behavior: Behavior normal.      ED Treatments / Results  Labs (all labs ordered are listed, but only abnormal results are displayed) Labs Reviewed  BASIC METABOLIC PANEL - Abnormal; Notable for the following components:      Result Value   Sodium 130 (*)    Chloride 94 (*)    CO2 21 (*)    All other components within normal limits  CBC - Abnormal; Notable for the following components:   Hemoglobin 12.6 (*)    MCV 74.9 (*)    MCH 23.4 (*)    RDW 18.6 (*)    All other components within normal limits  URINALYSIS, ROUTINE W REFLEX MICROSCOPIC  RAPID URINE DRUG SCREEN, HOSP PERFORMED  I-STAT TROPONIN, ED    EKG EKG Interpretation  Date/Time:  Friday August 06 2018 11:28:02 EST Ventricular Rate:  115 PR Interval:  154 QRS Duration: 84 QT Interval:  338 QTC Calculation: 467 R Axis:   94 Text Interpretation:  Sinus tachycardia Rightward axis Nonspecific ST abnormality Abnormal ECG Confirmed by Isla Pence [phone]) on 08/06/2018 11:54:14 AM   Radiology Dg Chest 2 View  Result Date: 08/06/2018 CLINICAL DATA:  Chest pain. EXAM: CHEST - 2 VIEW  COMPARISON:  08/27/2005. FINDINGS: Mediastinum hilar structures normal. Lungs are clear. No pleural effusion or pneumothorax. Heart size normal. IMPRESSION: No acute cardiopulmonary disease. Electronically Signed   By: Marcello Moores  Register   On: 08/06/2018 12:40    Procedures Procedures (including critical care time)  Medications Ordered in ED Medications  sodium chloride flush (NS) 0.9 %  injection 3 mL (3 mLs Intravenous Not Given 08/06/18 1247)  ipratropium-albuterol (DUONEB) 0.5-2.5 (3) MG/3ML nebulizer solution 3 mL (3 mLs Nebulization Given 08/06/18 1243)  methylPREDNISolone sodium succinate (SOLU-MEDROL) 125 mg/2 mL injection 125 mg (125 mg Intravenous Given 08/06/18 1239)  ondansetron (ZOFRAN) injection 4 mg (4 mg Intravenous Given 08/06/18 1239)  morphine 4 MG/ML injection 4 mg (4 mg Intravenous Given 08/06/18 1238)  sodium chloride 0.9 % bolus 1,000 mL (1,000 mLs Intravenous New Bag/Given 08/06/18 1237)  alum & mag hydroxide-simeth (MAALOX/MYLANTA) 200-200-20 MG/5ML suspension 30 mL (30 mLs Oral Given 08/06/18 1235)    And  lidocaine (XYLOCAINE) 2 % viscous mouth solution 15 mL (15 mLs Oral Given 08/06/18 1235)  LORazepam (ATIVAN) injection 1 mg (1 mg Intravenous Given 08/06/18 1239)     Initial Impression / Assessment and Plan / ED Course  I have reviewed the triage vital signs and the nursing notes.  Pertinent labs & imaging results that were available during my care of the patient were reviewed by me and considered in my medical decision making (see chart for details).       Pt is feeling much better after nebs.  He is encouraged to not use meth or tobacco.  He has an inhaler.  He is instructed to return if worse.  Final Clinical Impressions(s) / ED Diagnoses   Final diagnoses:  Methamphetamine abuse ([place])  Atypical chest pain  Mild intermittent asthma with exacerbation  Tobacco abuse    ED Discharge Orders         Ordered    predniSONE (STERAPRED UNI-PAK 21 TAB) 10 MG (21)  TBPK tablet     08/06/18 1514           Isla Pence, MD 08/06/18 1521

## 2018-08-06 NOTE — ED Notes (Signed)
Patient verbalizes understanding of discharge instructions. Opportunity for questioning and answers were provided. Armband removed by staff, pt discharged from ED ambulatory to home.  

## 2018-11-10 DIAGNOSIS — R4182 Altered mental status, unspecified: Secondary | ICD-10-CM | POA: Diagnosis not present

## 2018-11-10 DIAGNOSIS — R55 Syncope and collapse: Secondary | ICD-10-CM | POA: Diagnosis not present

## 2018-11-10 DIAGNOSIS — F17210 Nicotine dependence, cigarettes, uncomplicated: Secondary | ICD-10-CM | POA: Diagnosis not present

## 2018-11-10 DIAGNOSIS — R410 Disorientation, unspecified: Secondary | ICD-10-CM | POA: Diagnosis not present

## 2018-11-10 DIAGNOSIS — T675XXA Heat exhaustion, unspecified, initial encounter: Secondary | ICD-10-CM | POA: Diagnosis not present

## 2018-11-10 DIAGNOSIS — E860 Dehydration: Secondary | ICD-10-CM | POA: Diagnosis not present

## 2018-11-10 DIAGNOSIS — R404 Transient alteration of awareness: Secondary | ICD-10-CM | POA: Diagnosis not present

## 2018-11-10 DIAGNOSIS — R7989 Other specified abnormal findings of blood chemistry: Secondary | ICD-10-CM | POA: Diagnosis not present

## 2018-11-27 DIAGNOSIS — S92001A Unspecified fracture of right calcaneus, initial encounter for closed fracture: Secondary | ICD-10-CM | POA: Diagnosis not present

## 2018-11-27 DIAGNOSIS — F17210 Nicotine dependence, cigarettes, uncomplicated: Secondary | ICD-10-CM | POA: Diagnosis not present

## 2018-11-27 DIAGNOSIS — W109XXA Fall (on) (from) unspecified stairs and steps, initial encounter: Secondary | ICD-10-CM | POA: Diagnosis not present

## 2018-11-29 DIAGNOSIS — W174XXA Fall from dock, initial encounter: Secondary | ICD-10-CM | POA: Diagnosis not present

## 2018-11-29 DIAGNOSIS — S99911A Unspecified injury of right ankle, initial encounter: Secondary | ICD-10-CM | POA: Diagnosis not present

## 2018-11-29 DIAGNOSIS — S92014A Nondisplaced fracture of body of right calcaneus, initial encounter for closed fracture: Secondary | ICD-10-CM | POA: Diagnosis not present

## 2018-11-29 DIAGNOSIS — Y999 Unspecified external cause status: Secondary | ICD-10-CM | POA: Diagnosis not present

## 2018-12-02 DIAGNOSIS — S92014A Nondisplaced fracture of body of right calcaneus, initial encounter for closed fracture: Secondary | ICD-10-CM | POA: Diagnosis not present

## 2018-12-02 DIAGNOSIS — W1789XA Other fall from one level to another, initial encounter: Secondary | ICD-10-CM | POA: Diagnosis not present

## 2018-12-23 DIAGNOSIS — S92014D Nondisplaced fracture of body of right calcaneus, subsequent encounter for fracture with routine healing: Secondary | ICD-10-CM | POA: Diagnosis not present

## 2018-12-23 DIAGNOSIS — X58XXXD Exposure to other specified factors, subsequent encounter: Secondary | ICD-10-CM | POA: Diagnosis not present

## 2020-01-30 ENCOUNTER — Ambulatory Visit (HOSPITAL_COMMUNITY)
Admission: EM | Admit: 2020-01-30 | Discharge: 2020-01-30 | Disposition: A | Discharge status: Home / Self Care | Payer: BC Managed Care – PPO | Attending: Family Medicine | Admitting: Family Medicine

## 2020-01-30 ENCOUNTER — Encounter (HOSPITAL_COMMUNITY): Payer: Self-pay

## 2020-01-30 ENCOUNTER — Ambulatory Visit (INDEPENDENT_AMBULATORY_CARE_PROVIDER_SITE_OTHER): Payer: BC Managed Care – PPO

## 2020-01-30 ENCOUNTER — Other Ambulatory Visit: Payer: Self-pay

## 2020-01-30 DIAGNOSIS — S62344A Nondisplaced fracture of base of fourth metacarpal bone, right hand, initial encounter for closed fracture: Secondary | ICD-10-CM | POA: Diagnosis not present

## 2020-01-30 DIAGNOSIS — M7989 Other specified soft tissue disorders: Secondary | ICD-10-CM | POA: Diagnosis not present

## 2020-01-30 DIAGNOSIS — W2209XA Striking against other stationary object, initial encounter: Secondary | ICD-10-CM | POA: Diagnosis not present

## 2020-01-30 DIAGNOSIS — M79641 Pain in right hand: Secondary | ICD-10-CM | POA: Diagnosis not present

## 2020-01-30 MED ORDER — ACETAMINOPHEN 500 MG PO TABS: 1000.0000 mg | ORAL_TABLET | Freq: Three times a day (TID) | ORAL | 0 refills | Status: AC | PRN

## 2020-01-30 MED ORDER — IBUPROFEN 800 MG PO TABS: 800.0000 mg | ORAL_TABLET | Freq: Three times a day (TID) | ORAL | 0 refills | Status: DC | PRN

## 2020-01-30 NOTE — ED Triage Notes (Signed)
Pt c/o pain and swelling to right hand after injury working on house last night.

## 2020-01-30 NOTE — ED Provider Notes (Signed)
[facility]    CSN: [id number] Arrival date & time: 01/30/20  1022      History   Chief Complaint Chief Complaint  Patient presents with  . Hand Pain    HPI ZONG MCQUARRIE is a 39 y.o. male.   Patient presents with injury to right hand after punching a wall. Patient states he punched through drywall attempting to break the wall but hit a stud. Immediate pain and swelling in his hand. This occurred yesterday. Has iced and rested since. Denies numbness or tingling. Reports can move fingers.      Past Medical History:  Diagnosis Date  . Asthma   . Chickenpox   . GERD (gastroesophageal reflux disease)   . Seasonal allergies     Patient Active Problem List   Diagnosis Date Noted  . Pseudoarthrosis of cervical spine ([facility]) 11/17/2017  . Esophageal reflux 08/18/2015  . Pain of left lower extremity 08/18/2015  . Neck pain 08/18/2015  . Asthma 08/18/2015    Past Surgical History:  Procedure Laterality Date  . LEG SURGERY  10 or 11 years ago  . NECK SURGERY     anterior cervical  . POSTERIOR CERVICAL FUSION/FORAMINOTOMY N/A 11/17/2017   Procedure: CERVICAL SIX- CERVICAL SEVEN POSTERIOR CERVICAL FUSION;  Surgeon: Maeola Harman, MD;  Location: [facility] OR;  Service: Neurosurgery;  Laterality: N/A;  CERVICAL 6- CERVICAL 7 POSTERIOR CERVICAL FUSION       Home Medications    Prior to Admission medications   Medication Sig Start Date End Date Taking? Authorizing Provider  acetaminophen (TYLENOL) 500 MG tablet Take 2 tablets (1,000 mg total) by mouth every 8 (eight) hours as needed for moderate pain. 01/30/20   Suann Klier, Veryl Speak, PA-C  ibuprofen (IBU) 800 MG tablet Take 1 tablet (800 mg total) by mouth every 8 (eight) hours as needed. 01/30/20   Kierre Deines, Veryl Speak, PA-C    Family History Family History  Problem Relation Age of Onset  . Arthritis Mother   . Hypertension Mother   . Alcohol abuse Father   . Hyperlipidemia Father   . Hypertension Father   . Arthritis  Maternal Grandmother   . Stroke Maternal Grandmother   . Hypertension Maternal Grandmother   . Colon cancer Maternal Grandfather   . Stroke Maternal Grandfather     Social History Social History   Tobacco Use  . Smoking status: Current Every Day Smoker    Packs/day: 1.00    Types: Cigarettes  . Smokeless tobacco: Never Used  Vaping Use  . Vaping Use: Never used  Substance Use Topics  . Alcohol use: Yes    Alcohol/week: 0.0 standard drinks    Comment: case of beer a week  . Drug use: Yes    Types: Methamphetamines, Marijuana     Allergies   Patient has no known allergies.   Review of Systems Review of Systems   Physical Exam Triage Vital Signs ED Triage Vitals  Enc Vitals Group     BP 01/30/20 1124 (!) 163/100     Pulse Rate 01/30/20 1124 100     Resp 01/30/20 1124 16     Temp 01/30/20 1124 (!) 97 F (36.1 C)     Temp src --      SpO2 01/30/20 1124 100 %     Weight --      Height --      Head Circumference --      Peak Flow --      Pain Score  01/30/20 1125 9     Pain Loc --      Pain Edu? --      Excl. in GC? --    No data found.  Updated Vital Signs BP (!) 163/100   Pulse 100   Temp (!) 97 F (36.1 C)   Resp 16   SpO2 100%   Visual Acuity Right Eye Distance:   Left Eye Distance:   Bilateral Distance:    Right Eye Near:   Left Eye Near:    Bilateral Near:     Physical Exam Vitals and nursing note reviewed.  Musculoskeletal:     Comments: Right dorsum of hand with swelling and ecchymosis. Obvious deformity. TTP across metacarpals. Digits freely moving without deformity. Cap reful <2. Sensation intact.      UC Treatments / Results  Labs (all labs ordered are listed, but only abnormal results are displayed) Labs Reviewed - No data to display  EKG   Radiology DG Hand Complete Right  Result Date: 01/30/2020 CLINICAL DATA:  Pain and swelling after hitting wall. EXAM: RIGHT HAND - COMPLETE 3+ VIEW COMPARISON:  None FINDINGS: There is  abnormal dorsal soft tissue swelling overlying the metacarpal bones. Nondisplaced fracture involving the base of the fourth metacarpal bone is identified. Remote healed deformity of the fifth metacarpal bone is noted. No significant arthropathy. IMPRESSION: 1. Nondisplaced fracture involves the base of the fourth metacarpal bone. 2. Dorsal soft tissue swelling. Electronically Signed   By: Signa Kell M.D.   On: 01/30/2020 11:49    Procedures Procedures (including critical care time)  Medications Ordered in UC Medications - No data to display  Initial Impression / Assessment and Plan / UC Course  I have reviewed the triage vital signs and the nursing notes.  Pertinent labs & imaging results that were available during my care of the patient were reviewed by me and considered in my medical decision making (see chart for details).     #4th metacarpal fracture Patient is 39 year old presenting with 4th metacarpal fracture on right hand. No displacement. Placed in burkhalter short arm splint. Neurovascularly intact. Will have follow up with Hand. RICE discussed. Patient verbalized understanding plan of care.    Final Clinical Impressions(s) / UC Diagnoses   Final diagnoses:  Closed nondisplaced fracture of base of fourth metacarpal bone of right hand, initial encounter     Discharge Instructions     Take medications as prescribed - ibuprofen with food   Wear splint until follow with hand specialist  Call the number attached for follow up   Elevate and ice the top of your hand       ED Prescriptions    Medication Sig Dispense Auth. Provider   ibuprofen (IBU) 800 MG tablet Take 1 tablet (800 mg total) by mouth every 8 (eight) hours as needed. 30 tablet Magic Mohler, Veryl Speak, PA-C   acetaminophen (TYLENOL) 500 MG tablet Take 2 tablets (1,000 mg total) by mouth every 8 (eight) hours as needed for moderate pain. 30 tablet Markelle Najarian, Veryl Speak, PA-C     PDMP not reviewed this encounter.     Hermelinda Medicus, PA-C 01/30/20 2337

## 2020-01-30 NOTE — Discharge Instructions (Signed)
Take medications as prescribed - ibuprofen with food   Wear splint until follow with hand specialist  Call the number attached for follow up   Elevate and ice the top of your hand

## 2020-01-30 NOTE — Progress Notes (Signed)
Orthopedic Tech Progress Note Patient Details:  Luke Williams [birth year] [id number]  Ortho Devices Type of Ortho Device: Ulna gutter splint Ortho Device/Splint Location: Right Upper Extremity Ortho Device/Splint Interventions: Ordered, Application  Post Interventions Patient Tolerated: Well Instructions Provided: Adjustment of device, Care of device   [name] P Harle Stanford 01/30/2020, 1:05 PM

## 2020-03-20 ENCOUNTER — Ambulatory Visit: Payer: BC Managed Care – PPO | Admitting: Family Medicine

## 2020-03-26 ENCOUNTER — Encounter: Payer: Self-pay | Admitting: Primary Care

## 2020-03-26 ENCOUNTER — Ambulatory Visit: Payer: BC Managed Care – PPO | Admitting: Primary Care

## 2020-03-26 ENCOUNTER — Other Ambulatory Visit: Payer: Self-pay

## 2020-03-26 ENCOUNTER — Other Ambulatory Visit: Payer: Self-pay | Admitting: Primary Care

## 2020-03-26 VITALS — BP 126/84 | HR 65 | Temp 98.7°F | Ht 68.0 in | Wt 170.0 lb

## 2020-03-26 DIAGNOSIS — J4540 Moderate persistent asthma, uncomplicated: Secondary | ICD-10-CM | POA: Diagnosis not present

## 2020-03-26 DIAGNOSIS — K219 Gastro-esophageal reflux disease without esophagitis: Secondary | ICD-10-CM

## 2020-03-26 DIAGNOSIS — M25552 Pain in left hip: Secondary | ICD-10-CM

## 2020-03-26 MED ORDER — ALBUTEROL SULFATE HFA 108 (90 BASE) MCG/ACT IN AERS: 2.0000 | INHALATION_SPRAY | Freq: Four times a day (QID) | RESPIRATORY_TRACT | 2 refills | Status: AC | PRN

## 2020-03-26 MED ORDER — OMEPRAZOLE 20 MG PO CPDR: 20.0000 mg | DELAYED_RELEASE_CAPSULE | Freq: Every day | ORAL | 1 refills | Status: DC

## 2020-03-26 MED ORDER — PREDNISONE 20 MG PO TABS: ORAL_TABLET | ORAL | 0 refills | Status: DC

## 2020-03-26 MED ORDER — MONTELUKAST SODIUM 10 MG PO TABS: 10.0000 mg | ORAL_TABLET | Freq: Every day | ORAL | 3 refills | Status: DC

## 2020-03-26 NOTE — Telephone Encounter (Signed)
Please advise 

## 2020-03-26 NOTE — Assessment & Plan Note (Signed)
Acute without known trauma. Suspect sciatica now present.  Exam overall stable. Treat with prednisone course, stretching. He will update.

## 2020-03-26 NOTE — Patient Instructions (Addendum)
Start omeprazole 20 mg (Prilosec) daily for heartburn.   Start montelukast (Singulair) 10 mg at bedtime for allergies and asthma. This this every night.  Try to use the albuterol inhaler sparingly as discussed.  Start prednisone 20 mg for the sciatica hip pain. Take 2 tablets daily for three days, then 1 tablet daily for three days.  Please update me regarding your asthma, allergies, and heartburn.  It was a pleasure to see you today!

## 2020-03-26 NOTE — Assessment & Plan Note (Signed)
Chronic, daily use of Tums. Rx for omeprazole sent to pharmacy.  Discussed triggers.

## 2020-03-26 NOTE — Progress Notes (Signed)
Subjective:    Patient ID: Luke Williams, male    DOB: 11/06/1980, 39 y.o.   MRN: [id number]  HPI  This visit occurred during the SARS-CoV-2 public health emergency.  Safety protocols were in place, including screening questions prior to the visit, additional usage of staff PPE, and extensive cleaning of exam room while observing appropriate contact time as indicated for disinfecting solutions.   Mr. Kille is a 39 year old male with a history of GERD, Asthma, pseudoarthrosis of cervical spine who presents today with a chief complaint of hip pain and follow up.  1) Acute Hip Pain: His pain is located to the left hip which began about two weeks ago. He denies injury/trauma. He describes his pain as burning with numbness/tinlging that will radiate down to his left buttocks down to the posterior thigh to the knee. He's taken Ibuprofen with some improvement.    2) Asthma: Chronic for years, since childhood. Currently he's borrowing inhalers from family and friends, but he's using (and has been using) albuterol once to twice daily since the "age of 5". He wakes in the morning, takes 1-2 puffs of albuterol and is typically fine for the rest of the day. He has been prescribed maintenance inhalers in the past but he refuses to use them as he feels comfortable on albuterol.   He has never tried Singulair. He does struggle with allergies. 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   3) GERD: Chronic, daily symptoms of esophageal burning with belching. His symptoms will wake him during the night. He's currently taking Tums regularly for symptoms with temporary improvement. He will "borrow" a Prilosec from a friend and notices significant improvement. He is aware of the triggers for GERD.   Review of Systems  Respiratory: Positive for shortness of breath.   Gastrointestinal:       GERD symptoms  Musculoskeletal: Positive for arthralgias.  Allergic/Immunologic: Positive for  environmental allergies.  Neurological: Positive for numbness. Negative for weakness.       Past Medical History:  Diagnosis Date  . Asthma   . Chickenpox   . GERD (gastroesophageal reflux disease)   . Seasonal allergies      Social History   Socioeconomic History  . Marital status: Single    Spouse name: Not on file  . Number of children: Not on file  . Years of education: Not on file  . Highest education level: Not on file  Occupational History  . Not on file  Tobacco Use  . Smoking status: Current Every Day Smoker    Packs/day: 1.00    Types: Cigarettes  . Smokeless tobacco: Never Used  Vaping Use  . Vaping Use: Never used  Substance and Sexual Activity  . Alcohol use: Yes    Alcohol/week: 0.0 standard drinks    Comment: case of beer a week  . Drug use: Yes    Types: Methamphetamines, Marijuana  . Sexual activity: Not on file  Other Topics Concern  . Not on file  Social History Narrative   Married, separated.   2 children.   Works in [place].   Enjoys relaxing, golfing.    Social Determinants of Health   Financial Resource Strain:   . Difficulty of Paying Living Expenses: Not on file  Food Insecurity:   . Worried About Programme researcher, broadcasting/film/video in the Last Year: Not on file  . Ran Out of Food in the Last Year: Not on file  Transportation Needs:   .  Lack of Transportation (Medical): Not on file  . Lack of Transportation (Non-Medical): Not on file  Physical Activity:   . Days of Exercise per Week: Not on file  . Minutes of Exercise per Session: Not on file  Stress:   . Feeling of Stress : Not on file  Social Connections:   . Frequency of Communication with Friends and Family: Not on file  . Frequency of Social Gatherings with Friends and Family: Not on file  . Attends Religious Services: Not on file  . Active Member of Clubs or Organizations: Not on file  . Attends Banker Meetings: Not on file  . Marital Status: Not on file  Intimate  Partner Violence:   . Fear of Current or Ex-Partner: Not on file  . Emotionally Abused: Not on file  . Physically Abused: Not on file  . Sexually Abused: Not on file    Past Surgical History:  Procedure Laterality Date  . LEG SURGERY  10 or 11 years ago  . NECK SURGERY     anterior cervical  . POSTERIOR CERVICAL FUSION/FORAMINOTOMY N/A 11/17/2017   Procedure: CERVICAL SIX- CERVICAL SEVEN POSTERIOR CERVICAL FUSION;  Surgeon: Maeola Harman, MD;  Location: [facility] OR;  Service: Neurosurgery;  Laterality: N/A;  CERVICAL 6- CERVICAL 7 POSTERIOR CERVICAL FUSION    Family History  Problem Relation Age of Onset  . Arthritis Mother   . Hypertension Mother   . Alcohol abuse Father   . Hyperlipidemia Father   . Hypertension Father   . Arthritis Maternal Grandmother   . Stroke Maternal Grandmother   . Hypertension Maternal Grandmother   . Colon cancer Maternal Grandfather   . Stroke Maternal Grandfather     No Known Allergies  Current Outpatient Medications on File Prior to Visit  Medication Sig Dispense Refill  . acetaminophen (TYLENOL) 500 MG tablet Take 2 tablets (1,000 mg total) by mouth every 8 (eight) hours as needed for moderate pain. 30 tablet 0  . ibuprofen (IBU) 800 MG tablet Take 1 tablet (800 mg total) by mouth every 8 (eight) hours as needed. 30 tablet 0   No current facility-administered medications on file prior to visit.    BP 126/84   Pulse 65   Temp 98.7 F (37.1 C) (Temporal)   Ht 5\' 8"  (1.727 m)   Wt 170 lb (77.1 kg)   SpO2 97%   BMI 25.85 kg/m    Objective:   Physical Exam Cardiovascular:     Rate and Rhythm: Normal rate and regular rhythm.  Pulmonary:     Effort: Pulmonary effort is normal.     Breath sounds: Normal breath sounds. No wheezing.  Musculoskeletal:     Cervical back: Neck supple.     Comments: Slight limp noted to left lower extremity with ambulation. Negative straight leg raise. 5/5 strength to bilateral lower extremities.   Skin:     General: Skin is warm and dry.  Neurological:     Mental Status: He is alert.            Assessment & Plan:

## 2020-03-26 NOTE — Assessment & Plan Note (Signed)
Long discussion today regarding inappropriate use of albuterol, discussed the purpose of albuterol and the repercussions of continued daily use of albuterol.   Discussed importance of maintenance inhaler treatment if his symptoms are daily. He understands but continues to refuse maintenance inhaler Rx.  He does agree to trial Singulair HS for allergies and asthma. My hope is that he will no longer require use of his albuterol, but he will update.

## 2020-03-27 NOTE — Telephone Encounter (Signed)
So are they saying that omeprazole 20 mg isn't covered? They are requesting pantoprazole? Sorry, this looks confusing.

## 2020-03-30 NOTE — Telephone Encounter (Signed)
Spoke with Kathlene November at [address] asking about covered alternative.  He confirms pt's ins co will cover pantoprazole 20 mg tab.

## 2020-03-30 NOTE — Telephone Encounter (Signed)
Noted, changed to pantoprazole 20 mg. Sent to pharmacy.

## 2020-05-16 DIAGNOSIS — M5416 Radiculopathy, lumbar region: Secondary | ICD-10-CM | POA: Diagnosis not present

## 2020-05-16 DIAGNOSIS — M25552 Pain in left hip: Secondary | ICD-10-CM | POA: Diagnosis not present

## 2020-05-16 DIAGNOSIS — Z6825 Body mass index (BMI) 25.0-25.9, adult: Secondary | ICD-10-CM | POA: Diagnosis not present

## 2020-05-16 DIAGNOSIS — R030 Elevated blood-pressure reading, without diagnosis of hypertension: Secondary | ICD-10-CM | POA: Diagnosis not present

## 2020-09-20 ENCOUNTER — Other Ambulatory Visit: Payer: Self-pay | Admitting: Orthopaedic Surgery

## 2020-09-20 DIAGNOSIS — Z01818 Encounter for other preprocedural examination: Secondary | ICD-10-CM

## 2020-09-28 ENCOUNTER — Ambulatory Visit: Payer: BC Managed Care – PPO | Admitting: Primary Care

## 2020-10-05 ENCOUNTER — Encounter (HOSPITAL_COMMUNITY): Payer: Self-pay

## 2020-10-05 ENCOUNTER — Other Ambulatory Visit (HOSPITAL_COMMUNITY): Payer: BC Managed Care – PPO

## 2020-10-08 ENCOUNTER — Telehealth: Payer: Self-pay | Admitting: Primary Care

## 2020-10-08 NOTE — Telephone Encounter (Signed)
He is set to have his surgical clearnace on 5/4 with Dr. Ermalene Searing @ 8:20

## 2020-10-09 ENCOUNTER — Ambulatory Visit: Admit: 2020-10-09 | Payer: BC Managed Care – PPO | Admitting: Orthopaedic Surgery

## 2020-10-09 SURGERY — ARTHROPLASTY, HIP, TOTAL, ANTERIOR APPROACH
Anesthesia: Spinal | Site: Hip | Laterality: Left

## 2020-10-10 ENCOUNTER — Ambulatory Visit (INDEPENDENT_AMBULATORY_CARE_PROVIDER_SITE_OTHER): Payer: 59 | Admitting: Family Medicine

## 2020-10-10 ENCOUNTER — Other Ambulatory Visit: Payer: Self-pay

## 2020-10-10 ENCOUNTER — Encounter: Payer: Self-pay | Admitting: Family Medicine

## 2020-10-10 VITALS — BP 118/72 | HR 60 | Temp 98.0°F | Ht 68.0 in | Wt 171.0 lb

## 2020-10-10 DIAGNOSIS — Z01818 Encounter for other preprocedural examination: Secondary | ICD-10-CM

## 2020-10-10 LAB — POCT GLYCOSYLATED HEMOGLOBIN (HGB A1C): Hemoglobin A1C: 5.4 % (ref 4.0–5.6)

## 2020-10-10 NOTE — Assessment & Plan Note (Signed)
No cardiovascular symptoms.  Able to tolerate a high level of mets from a cardiovascular stand point. BMI < 40  A1C in normal range. Has pre-op labs and EKG scheduled.  Low risk for moderately invasive orthopedic surgery with spinal anesthesia.

## 2020-10-10 NOTE — Progress Notes (Signed)
Patient ID: Luke Williams, male    DOB: 1980-10-12, 40 y.o.   MRN: [id number]  This visit was conducted in person.  BP 118/72 (BP Location: Left Arm, Patient Position: Sitting, Cuff Size: Normal)   Pulse 60   Temp 98 F (36.7 C) (Temporal)   Ht 5\' 8"  (1.727 m)   Wt 171 lb (77.6 kg)   SpO2 98%   BMI 26.00 kg/m    CC:  Chief Complaint  Patient presents with  . Pre-op Exam    Subjective:   HPI: Luke Williams is a 40 y.o. male presenting on 10/10/2020 for Pre-op Exam  He present for pre-op evaluation for upcoming left hip replacement. Surgeon  Dr. 12/10/2020.  He will be receiving spinal anesthesia.  Body mass index is 26 kg/m.  He has history of asthma, mild intermittent. Using Singulair off and on.  Using albuterol prn when need.  Able walk up a flight of stairs easily without any chest pain, no SOb. He is only limited by left hip pain.Luke Williams He works as a [employer].  He has had several neck surgeries in past. No complications from surgery.  No issues with anesthesia or healing.   Hx of MRSA in past... 15 years ago, no issues     Relevant past medical, surgical, family and social history reviewed and updated as indicated. Interim medical history since our last visit reviewed. Allergies and medications reviewed and updated. Outpatient Medications Prior to Visit  Medication Sig Dispense Refill  . acetaminophen (TYLENOL) 500 MG tablet Take 2 tablets (1,000 mg total) by mouth every 8 (eight) hours as needed for moderate pain. 30 tablet 0  . albuterol (VENTOLIN HFA) 108 (90 Base) MCG/ACT inhaler Inhale 2 puffs into the lungs every 6 (six) hours as needed for shortness of breath. 1 each 2  . HYDROcodone-acetaminophen (NORCO/VICODIN) 5-325 MG tablet Take 1 tablet by mouth 2 (two) times daily as needed for moderate pain.    Administrator ibuprofen (IBU) 800 MG tablet Take 1 tablet (800 mg total) by mouth every 8 (eight) hours as needed. 30 tablet 0  . montelukast (SINGULAIR) 10 MG  tablet Take 1 tablet (10 mg total) by mouth at bedtime. For asthma and allergies. 90 tablet 3  . pantoprazole (PROTONIX) 20 MG tablet Take 1 tablet (20 mg total) by mouth daily. For heartburn. 90 tablet 1  . predniSONE (DELTASONE) 20 MG tablet Take 2 tablets daily for three days, then 1 tablet daily for three days. 9 tablet 0   No facility-administered medications prior to visit.     Per HPI unless specifically indicated in ROS section below Review of Systems  Constitutional: Negative for fatigue and fever.  HENT: Negative for ear pain.   Eyes: Negative for pain.  Respiratory: Negative for cough and shortness of breath.   Cardiovascular: Negative for chest pain, palpitations and leg swelling.  Gastrointestinal: Negative for abdominal pain.  Genitourinary: Negative for dysuria.  Musculoskeletal: Negative for arthralgias.  Neurological: Negative for syncope, light-headedness and headaches.  Psychiatric/Behavioral: Negative for dysphoric mood.   Objective:  BP 118/72 (BP Location: Left Arm, Patient Position: Sitting, Cuff Size: Normal)   Pulse 60   Temp 98 F (36.7 C) (Temporal)   Ht 5\' 8"  (1.727 m)   Wt 171 lb (77.6 kg)   SpO2 98%   BMI 26.00 kg/m   Wt Readings from Last 3 Encounters:  10/10/20 171 lb (77.6 kg)  03/26/20 170 lb (77.1 kg)  11/10/17 182 lb  9.6 oz (82.8 kg)      Physical Exam Constitutional:      Appearance: He is well-developed.  HENT:     Head: Normocephalic.     Right Ear: Hearing normal.     Left Ear: Hearing normal.     Nose: Nose normal.  Neck:     Thyroid: No thyroid mass or thyromegaly.     Vascular: No carotid bruit.     Trachea: Trachea normal.  Cardiovascular:     Rate and Rhythm: Normal rate and regular rhythm.     Pulses: Normal pulses.     Heart sounds: Heart sounds not distant. No murmur heard. No friction rub. No gallop.      Comments: No peripheral edema Pulmonary:     Effort: Pulmonary effort is normal. No respiratory distress.      Breath sounds: Normal breath sounds.  Skin:    General: Skin is warm and dry.     Findings: No rash.  Psychiatric:        Speech: Speech normal.        Behavior: Behavior normal.        Thought Content: Thought content normal.       Results for orders placed or performed during the hospital encounter of 08/06/18  Basic metabolic panel  Result Value Ref Range   Sodium 130 (L) 135 - 145 mmol/L   Potassium 4.5 3.5 - 5.1 mmol/L   Chloride 94 (L) 98 - 111 mmol/L   CO2 21 (L) 22 - 32 mmol/L   Glucose, Bld 98 70 - 99 mg/dL   BUN 9 6 - 20 mg/dL   Creatinine, Ser 0.62 0.61 - 1.24 mg/dL   Calcium 69.4 8.9 - 85.4 mg/dL   GFR calc non Af Amer >60 >60 mL/min   GFR calc Af Amer >60 >60 mL/min   Anion gap 15 5 - 15  CBC  Result Value Ref Range   WBC 8.4 4.0 - 10.5 K/uL   RBC 5.38 4.22 - 5.81 MIL/uL   Hemoglobin 12.6 (L) 13.0 - 17.0 g/dL   HCT 62.7 03.5 - 00.9 %   MCV 74.9 (L) 80.0 - 100.0 fL   MCH 23.4 (L) 26.0 - 34.0 pg   MCHC 31.3 30.0 - 36.0 g/dL   RDW 38.1 (H) 82.9 - 93.7 %   Platelets 312 150 - 400 K/uL   nRBC 0.0 0.0 - 0.2 %  I-stat troponin, ED  Result Value Ref Range   Troponin i, poc 0.01 0.00 - 0.08 ng/mL   Comment 3            This visit occurred during the SARS-CoV-2 public health emergency.  Safety protocols were in place, including screening questions prior to the visit, additional usage of staff PPE, and extensive cleaning of exam room while observing appropriate contact time as indicated for disinfecting solutions.   COVID 19 screen:  No recent travel or known exposure to COVID19 The patient denies respiratory symptoms of COVID 19 at this time. The importance of social distancing was discussed today.   Assessment and Plan Problem List Items Addressed This Visit    Pre-op evaluation - Primary    No cardiovascular symptoms.  Able to tolerate a high level of mets from a cardiovascular stand point. BMI < 40  A1C in normal range. Has pre-op labs and EKG  scheduled.  Low risk for moderately invasive orthopedic surgery with spinal anesthesia.       Relevant Orders  HgB A1c         Kerby Nora, MD

## 2020-11-02 ENCOUNTER — Other Ambulatory Visit: Payer: Self-pay | Admitting: Orthopaedic Surgery

## 2021-11-13 ENCOUNTER — Other Ambulatory Visit: Payer: Self-pay | Admitting: Orthopaedic Surgery

## 2021-11-14 ENCOUNTER — Encounter (HOSPITAL_COMMUNITY): Payer: Self-pay

## 2021-11-18 NOTE — Progress Notes (Signed)
DUE TO COVID-19 ONLY 2 VISITOR IS ALLOWED TO COME WITH YOU AND STAY IN THE WAITING ROOM ONLY DURING PRE OP AND PROCEDURE DAY OF SURGERY.  4  VISITOR  MAY VISIT WITH YOU AFTER SURGERY IN YOUR PRIVATE ROOM DURING VISITING HOURS ONLY! YOU MAY HAVE ONE PERSON SPEND THE NITE WITH YOU IN YOUR ROOM AFTER SURGERY.     Your procedure is scheduled on:         11/26/21   Report to [facility] Main  Entrance   Report to admitting at    1045             AM DO NOT BRING INSURANCE CARD, PICTURE ID OR WALLET DAY OF SURGERY.      Call this number if you have problems the morning of surgery [phone]    REMEMBER: NO  SOLID FOODS , CANDY, GUM OR MINTS AFTER MIDNITE THE NITE BEFORE SURGERY .       [employer] CLEAR LIQUIDS UNTIL       1030 am          DAY OF SURGERY.      PLEASE FINISH ENSURE DRINK PER SURGEON ORDER  WHICH NEEDS TO BE COMPLETED AT      1030am      MORNING OF SURGERY.       CLEAR LIQUID DIET   Foods Allowed      WATER BLACK COFFEE ( SUGAR OK, NO MILK, CREAM OR CREAMER) REGULAR AND DECAF  TEA ( SUGAR OK NO MILK, CREAM, OR CREAMER) REGULAR AND DECAF  PLAIN JELLO ( NO RED)  FRUIT ICES ( NO RED, NO FRUIT PULP)  POPSICLES ( NO RED)  JUICE- APPLE, WHITE GRAPE AND WHITE CRANBERRY  SPORT DRINK LIKE GATORADE ( NO RED)  CLEAR BROTH ( VEGETABLE , CHICKEN OR BEEF)                                                                     BRUSH YOUR TEETH MORNING OF SURGERY AND RINSE YOUR MOUTH OUT, NO CHEWING GUM CANDY OR MINTS.     Take these medicines the morning of surgery with A SIP OF WATER:  inhalers as usual and bring,    DO NOT TAKE ANY DIABETIC MEDICATIONS DAY OF YOUR SURGERY                               You may not have any metal on your body including hair pins and              piercings  Do not wear jewelry, make-up, lotions, powders or perfumes, deodorant             Do not wear nail polish on your fingernails.              IF YOU ARE A MALE AND WANT TO SHAVE UNDER ARMS OR LEGS  PRIOR TO SURGERY YOU MUST DO SO AT LEAST 48 HOURS PRIOR TO SURGERY.              Men may shave face and neck.   Do not bring valuables to the hospital. [facility] IS NOT  RESPONSIBLE   FOR VALUABLES.  Contacts, dentures or bridgework may not be worn into surgery.  Leave suitcase in the car. After surgery it may be brought to your room.     Patients discharged the day of surgery will not be allowed to drive home. IF YOU ARE HAVING SURGERY AND GOING HOME THE SAME DAY, YOU MUST HAVE AN ADULT TO DRIVE YOU HOME AND BE WITH YOU FOR 24 HOURS. YOU MAY GO HOME BY TAXI OR UBER OR ORTHERWISE, BUT AN ADULT MUST ACCOMPANY YOU HOME AND STAY WITH YOU FOR 24 HOURS.                Please read over the following fact sheets you were given: _____________________________________________________________________  [facility] - Preparing for Surgery Before surgery, you can play an important role.  Because skin is not sterile, your skin needs to be as free of germs as possible.  You can reduce the number of germs on your skin by washing with CHG (chlorahexidine gluconate) soap before surgery.  CHG is an antiseptic cleaner which kills germs and bonds with the skin to continue killing germs even after washing. Please DO NOT use if you have an allergy to CHG or antibacterial soaps.  If your skin becomes reddened/irritated stop using the CHG and inform your nurse when you arrive at Short Stay. Do not shave (including legs and underarms) for at least 48 hours prior to the first CHG shower.  You may shave your face/neck. Please follow these instructions carefully:  1.  Shower with CHG Soap the night before surgery and the  morning of Surgery.  2.  If you choose to wash your hair, wash your hair first as usual with your  normal  shampoo.  3.  After you shampoo, rinse your hair and body thoroughly to remove the  shampoo.                           4.  Use CHG as you would any other liquid soap.  You can apply chg  directly  to the skin and wash                       Gently with a scrungie or clean washcloth.  5.  Apply the CHG Soap to your body ONLY FROM THE NECK DOWN.   Do not use on face/ open                           Wound or open sores. Avoid contact with eyes, ears mouth and genitals (private parts).                       Wash face,  Genitals (private parts) with your normal soap.             6.  Wash thoroughly, paying special attention to the area where your surgery  will be performed.  7.  Thoroughly rinse your body with warm water from the neck down.  8.  DO NOT shower/wash with your normal soap after using and rinsing off  the CHG Soap.                9.  Pat yourself dry with a clean towel.            10.  Wear clean pajamas.  11.  Place clean sheets on your bed the night of your first shower and do not  sleep with pets. Day of Surgery : Do not apply any lotions/deodorants the morning of surgery.  Please wear clean clothes to the hospital/surgery center.  FAILURE TO FOLLOW THESE INSTRUCTIONS MAY RESULT IN THE CANCELLATION OF YOUR SURGERY PATIENT SIGNATURE_________________________________  NURSE SIGNATURE__________________________________  ________________________________________________________________________

## 2021-11-18 NOTE — Progress Notes (Addendum)
Anesthesia Review:  PCP: aMY BEDSOLE- lVO 10/20/20  Cardiologist : Chest x-ray : 11/20/21  EKG :  11/20/21  Echo : Stress test: Cardiac Cath :  Activity level:  Sleep Study/ CPAP : Fasting Blood Sugar :      / Checks Blood Sugar -- times a day:   Blood Thinner/ Instructions /Last Dose: ASA / Instructions/ Last Dose :

## 2021-11-20 ENCOUNTER — Other Ambulatory Visit: Payer: Self-pay

## 2021-11-20 ENCOUNTER — Ambulatory Visit (HOSPITAL_COMMUNITY)
Admission: RE | Admit: 2021-11-20 | Discharge: 2021-11-20 | Disposition: A | Discharge status: Home / Self Care | Payer: Commercial Managed Care - HMO | Source: Ambulatory Visit | Attending: Orthopaedic Surgery | Admitting: Orthopaedic Surgery

## 2021-11-20 ENCOUNTER — Encounter (HOSPITAL_COMMUNITY)
Admission: RE | Admit: 2021-11-20 | Discharge: 2021-11-20 | Disposition: A | Discharge status: Home / Self Care | Payer: Commercial Managed Care - HMO | Source: Ambulatory Visit | Attending: Orthopaedic Surgery | Admitting: Orthopaedic Surgery

## 2021-11-20 ENCOUNTER — Encounter (HOSPITAL_COMMUNITY): Payer: Self-pay

## 2021-11-20 DIAGNOSIS — Z01818 Encounter for other preprocedural examination: Secondary | ICD-10-CM | POA: Diagnosis present

## 2021-11-20 LAB — CBC WITH DIFFERENTIAL/PLATELET
Abs Immature Granulocytes: 0.02 10*3/uL (ref 0.00–0.07)
Basophils Absolute: 0.1 10*3/uL (ref 0.0–0.1)
Basophils Relative: 1 %
Eosinophils Absolute: 0.5 10*3/uL (ref 0.0–0.5)
Eosinophils Relative: 9 %
HCT: 42.4 % (ref 39.0–52.0)
Hemoglobin: 14 g/dL (ref 13.0–17.0)
Immature Granulocytes: 0 %
Lymphocytes Relative: 28 %
Lymphs Abs: 1.5 10*3/uL (ref 0.7–4.0)
MCH: 30.9 pg (ref 26.0–34.0)
MCHC: 33 g/dL (ref 30.0–36.0)
MCV: 93.6 fL (ref 80.0–100.0)
Monocytes Absolute: 0.6 10*3/uL (ref 0.1–1.0)
Monocytes Relative: 12 %
Neutro Abs: 2.6 10*3/uL (ref 1.7–7.7)
Neutrophils Relative %: 50 %
Platelets: 251 10*3/uL (ref 150–400)
RBC: 4.53 MIL/uL (ref 4.22–5.81)
RDW: 13.1 % (ref 11.5–15.5)
WBC: 5.2 10*3/uL (ref 4.0–10.5)
nRBC: 0 % (ref 0.0–0.2)

## 2021-11-20 LAB — URINALYSIS, ROUTINE W REFLEX MICROSCOPIC
Bilirubin Urine: NEGATIVE
Glucose, UA: NEGATIVE mg/dL
Hgb urine dipstick: NEGATIVE
Ketones, ur: NEGATIVE mg/dL
Leukocytes,Ua: NEGATIVE
Nitrite: NEGATIVE
Protein, ur: NEGATIVE mg/dL
Specific Gravity, Urine: 1.012 (ref 1.005–1.030)
pH: 6 (ref 5.0–8.0)

## 2021-11-20 LAB — SURGICAL PCR SCREEN
MRSA, PCR: NEGATIVE
Staphylococcus aureus: NEGATIVE

## 2021-11-20 LAB — BASIC METABOLIC PANEL
Anion gap: 6 (ref 5–15)
BUN: 10 mg/dL (ref 6–20)
CO2: 30 mmol/L (ref 22–32)
Calcium: 9.4 mg/dL (ref 8.9–10.3)
Chloride: 102 mmol/L (ref 98–111)
Creatinine, Ser: 0.72 mg/dL (ref 0.61–1.24)
GFR, Estimated: >60 mL/min (ref >60)
Glucose, Bld: 98 mg/dL (ref 70–99)
Potassium: 4.9 mmol/L (ref 3.5–5.1)
Sodium: 138 mmol/L (ref 135–145)

## 2021-11-20 LAB — PROTIME-INR
INR: 1 (ref 0.8–1.2)
Prothrombin Time: 12.9 seconds (ref 11.4–15.2)

## 2021-11-20 LAB — APTT: aPTT: 29 seconds (ref 24–36)

## 2021-11-20 NOTE — H&P (Signed)
TOTAL HIP ADMISSION H&P  Patient is admitted for left total hip arthroplasty.  Subjective:  Chief Complaint: left hip pain  HPI: Luke Williams, 41 y.o. male, has a history of pain and functional disability in the left hip(s) due to arthritis and patient has failed non-surgical conservative treatments for greater than 12 weeks to include NSAID's and/or analgesics, flexibility and strengthening excercises, supervised PT with diminished ADL's post treatment, use of assistive devices, weight reduction as appropriate, and activity modification.  Onset of symptoms was gradual starting 5 years ago with gradually worsening course since that time.The patient noted no past surgery on the left hip(s).  Patient currently rates pain in the left hip at 10 out of 10 with activity. Patient has night pain, worsening of pain with activity and weight bearing, trendelenberg gait, pain with passive range of motion, crepitus, and joint swelling. Patient has evidence of subchondral cysts, subchondral sclerosis, periarticular osteophytes, and joint space narrowing by imaging studies. This condition presents safety issues increasing the risk of falls. There is no current active infection.  Patient Active Problem List   Diagnosis Date Noted   Pre-op evaluation 10/10/2020   Acute hip pain, left 03/26/2020   Pseudoarthrosis of cervical spine (HCC) 11/17/2017   Esophageal reflux 08/18/2015   Pain of left lower extremity 08/18/2015   Neck pain 08/18/2015   Asthma 08/18/2015   Past Medical History:  Diagnosis Date   Asthma    Chickenpox    Seasonal allergies     Past Surgical History:  Procedure Laterality Date   LEG SURGERY  10 or 11 years ago   NECK SURGERY     anterior cervical   POSTERIOR CERVICAL FUSION/FORAMINOTOMY N/A 11/17/2017   Procedure: CERVICAL SIX- CERVICAL SEVEN POSTERIOR CERVICAL FUSION;  Surgeon: Maeola Harman, MD;  Location: [facility] OR;  Service: Neurosurgery;  Laterality: N/A;  CERVICAL 6- CERVICAL  7 POSTERIOR CERVICAL FUSION    No current facility-administered medications for this encounter.   Current Outpatient Medications  Medication Sig Dispense Refill Last Dose   albuterol (VENTOLIN HFA) 108 (90 Base) MCG/ACT inhaler Inhale 2 puffs into the lungs every 6 (six) hours as needed for shortness of breath. 1 each 2    acetaminophen (TYLENOL) 500 MG tablet Take 2 tablets (1,000 mg total) by mouth every 8 (eight) hours as needed for moderate pain. (Patient not taking: Reported on 11/15/2021) 30 tablet 0 Not Taking   ibuprofen (IBU) 800 MG tablet Take 1 tablet (800 mg total) by mouth every 8 (eight) hours as needed. (Patient not taking: Reported on 11/15/2021) 30 tablet 0 Not Taking   montelukast (SINGULAIR) 10 MG tablet Take 1 tablet (10 mg total) by mouth at bedtime. For asthma and allergies. (Patient not taking: Reported on 11/15/2021) 90 tablet 3 Not Taking   pantoprazole (PROTONIX) 20 MG tablet Take 1 tablet (20 mg total) by mouth daily. For heartburn. (Patient not taking: Reported on 11/15/2021) 90 tablet 1 Not Taking   No Known Allergies  Social History   Tobacco Use   Smoking status: Former    Packs/day: 1.00    Types: Cigarettes    Quit date: 11/18/2021   Smokeless tobacco: Never  Substance Use Topics   Alcohol use: Yes    Comment: two times per week - beer    Family History  Problem Relation Age of Onset   Arthritis Mother    Hypertension Mother    Alcohol abuse Father    Hyperlipidemia Father    Hypertension Father  Arthritis Maternal Grandmother    Stroke Maternal Grandmother    Hypertension Maternal Grandmother    Colon cancer Maternal Grandfather    Stroke Maternal Grandfather      Review of Systems  Musculoskeletal:  Positive for arthralgias.       Left hip  All other systems reviewed and are negative.   Objective:  Physical Exam Constitutional:      Appearance: Normal appearance.  HENT:     Head: Normocephalic and atraumatic.     Nose: Nose normal.      Mouth/Throat:     Pharynx: Oropharynx is clear.  Eyes:     Extraocular Movements: Extraocular movements intact.  Cardiovascular:     Rate and Rhythm: Normal rate.  Pulmonary:     Effort: Pulmonary effort is normal.  Abdominal:     Palpations: Abdomen is soft.  Musculoskeletal:     Cervical back: Normal range of motion.     Comments: Left hip motion remains extremely painful.  Sensation is intact distally.  He has palpable pulses in his feet.  Opposite hip moves fully.    Skin:    General: Skin is warm and dry.  Neurological:     General: No focal deficit present.     Mental Status: He is alert and oriented to person, place, and time. Mental status is at baseline.  Psychiatric:        Mood and Affect: Mood normal.        Behavior: Behavior normal.        Thought Content: Thought content normal.        Judgment: Judgment normal.     Vital signs in last 24 hours: Temp:  [97.8 F (36.6 C)] 97.8 F (36.6 C) (06/14 0912) Pulse Rate:  [65] 65 (06/14 0912) Resp:  [16] 16 (06/14 0912) BP: (135)/(82) 135/82 (06/14 0912) SpO2:  [98 %] 98 % (06/14 0912) Weight:  [86.6 kg] 86.6 kg (06/14 0912)  Labs:   Estimated body mass index is 27.41 kg/m as calculated from the following:   Height as of 11/20/21: 5\' 10"  (1.778 m).   Weight as of 11/20/21: 86.6 kg.   Imaging Review Plain radiographs demonstrate severe degenerative joint disease of the left hip(s). The bone quality appears to be good for age and reported activity level.      Assessment/Plan:  End stage primary arthritis, right hip(s)  The patient history, physical examination, clinical judgement of the provider and imaging studies are consistent with end stage degenerative joint disease of the left hip(s) and total hip arthroplasty is deemed medically necessary. The treatment options including medical management, injection therapy, arthroscopy and arthroplasty were discussed at length. The risks and benefits of total hip  arthroplasty were presented and reviewed. The risks due to aseptic loosening, infection, stiffness, dislocation/subluxation,  thromboembolic complications and other imponderables were discussed.  The patient acknowledged the explanation, agreed to proceed with the plan and consent was signed. Patient is being admitted for inpatient treatment for surgery, pain control, PT, OT, prophylactic antibiotics, VTE prophylaxis, progressive ambulation and ADL's and discharge planning.The patient is planning to be discharged home with home health services

## 2021-11-25 MED ORDER — TRANEXAMIC ACID 1000 MG/10ML IV SOLN
2000.0000 mg | INTRAVENOUS | Status: DC
  Filled 2021-11-25: qty 20

## 2021-11-26 ENCOUNTER — Other Ambulatory Visit: Payer: Self-pay

## 2021-11-26 ENCOUNTER — Ambulatory Visit (HOSPITAL_COMMUNITY)
Admission: RE | Admit: 2021-11-26 | Discharge: 2021-11-26 | Disposition: A | Discharge status: Home / Self Care | Payer: Commercial Managed Care - HMO | Attending: Orthopaedic Surgery | Admitting: Orthopaedic Surgery

## 2021-11-26 ENCOUNTER — Emergency Department (HOSPITAL_COMMUNITY)
Admission: EM | Admit: 2021-11-26 | Discharge: 2021-11-27 | Discharge status: Against Medical Advice | Payer: Commercial Managed Care - HMO | Source: Home / Self Care | Attending: Emergency Medicine | Admitting: Emergency Medicine

## 2021-11-26 ENCOUNTER — Encounter (HOSPITAL_COMMUNITY)
Admission: RE | Disposition: A | Discharge status: Home / Self Care | Payer: Self-pay | Source: Home / Self Care | Attending: Orthopaedic Surgery

## 2021-11-26 ENCOUNTER — Encounter (HOSPITAL_COMMUNITY): Payer: Self-pay | Admitting: Orthopaedic Surgery

## 2021-11-26 ENCOUNTER — Ambulatory Visit (HOSPITAL_COMMUNITY): Payer: Commercial Managed Care - HMO

## 2021-11-26 ENCOUNTER — Ambulatory Visit (HOSPITAL_BASED_OUTPATIENT_CLINIC_OR_DEPARTMENT_OTHER): Payer: Commercial Managed Care - HMO | Admitting: Certified Registered"

## 2021-11-26 ENCOUNTER — Encounter (HOSPITAL_COMMUNITY): Payer: Self-pay

## 2021-11-26 ENCOUNTER — Emergency Department (HOSPITAL_COMMUNITY): Payer: Commercial Managed Care - HMO

## 2021-11-26 ENCOUNTER — Ambulatory Visit (HOSPITAL_COMMUNITY): Payer: Commercial Managed Care - HMO | Admitting: Physician Assistant

## 2021-11-26 DIAGNOSIS — M1612 Unilateral primary osteoarthritis, left hip: Secondary | ICD-10-CM | POA: Diagnosis present

## 2021-11-26 DIAGNOSIS — K219 Gastro-esophageal reflux disease without esophagitis: Secondary | ICD-10-CM | POA: Diagnosis not present

## 2021-11-26 DIAGNOSIS — Z7982 Long term (current) use of aspirin: Secondary | ICD-10-CM | POA: Insufficient documentation

## 2021-11-26 DIAGNOSIS — Z765 Malingerer [conscious simulation]: Secondary | ICD-10-CM | POA: Insufficient documentation

## 2021-11-26 DIAGNOSIS — Z5329 Procedure and treatment not carried out because of patient's decision for other reasons: Secondary | ICD-10-CM | POA: Insufficient documentation

## 2021-11-26 DIAGNOSIS — J45909 Unspecified asthma, uncomplicated: Secondary | ICD-10-CM | POA: Insufficient documentation

## 2021-11-26 DIAGNOSIS — Z01818 Encounter for other preprocedural examination: Secondary | ICD-10-CM

## 2021-11-26 DIAGNOSIS — M25552 Pain in left hip: Secondary | ICD-10-CM | POA: Insufficient documentation

## 2021-11-26 DIAGNOSIS — Z87891 Personal history of nicotine dependence: Secondary | ICD-10-CM | POA: Insufficient documentation

## 2021-11-26 DIAGNOSIS — Z96642 Presence of left artificial hip joint: Secondary | ICD-10-CM | POA: Insufficient documentation

## 2021-11-26 HISTORY — PX: TOTAL HIP ARTHROPLASTY: SHX124

## 2021-11-26 LAB — TYPE AND SCREEN
ABO/RH(D): A NEG
Antibody Screen: NEGATIVE

## 2021-11-26 SURGERY — ARTHROPLASTY, HIP, TOTAL, ANTERIOR APPROACH
Anesthesia: Spinal | Site: Hip | Laterality: Left

## 2021-11-26 MED ORDER — BUPIVACAINE-EPINEPHRINE (PF) 0.25% -1:200000 IJ SOLN
INTRAMUSCULAR | Status: AC
  Filled 2021-11-26: qty 30

## 2021-11-26 MED ORDER — TRANEXAMIC ACID-NACL 1000-0.7 MG/100ML-% IV SOLN
1000.0000 mg | INTRAVENOUS | Status: AC
  Administered 2021-11-26: 1000 mg via INTRAVENOUS
  Filled 2021-11-26: qty 100

## 2021-11-26 MED ORDER — HYDROMORPHONE HCL 1 MG/ML IJ SOLN
1.0000 mg | Freq: Once | INTRAMUSCULAR | Status: AC
  Administered 2021-11-26: 1 mg via INTRAVENOUS
  Filled 2021-11-26: qty 1

## 2021-11-26 MED ORDER — TIZANIDINE HCL 4 MG PO TABS: 4.0000 mg | ORAL_TABLET | Freq: Four times a day (QID) | ORAL | 1 refills | Status: AC | PRN

## 2021-11-26 MED ORDER — POVIDONE-IODINE 10 % EX SWAB
2.0000 | Freq: Once | CUTANEOUS | Status: AC
  Administered 2021-11-26: 2 via TOPICAL

## 2021-11-26 MED ORDER — LACTATED RINGERS IV SOLN: INTRAVENOUS | Status: DC

## 2021-11-26 MED ORDER — DEXAMETHASONE SODIUM PHOSPHATE 10 MG/ML IJ SOLN
INTRAMUSCULAR | Status: AC
  Filled 2021-11-26: qty 1

## 2021-11-26 MED ORDER — DEXAMETHASONE SODIUM PHOSPHATE 10 MG/ML IJ SOLN
INTRAMUSCULAR | Status: DC | PRN
  Administered 2021-11-26: 8 mg via INTRAVENOUS

## 2021-11-26 MED ORDER — ONDANSETRON HCL 4 MG/2ML IJ SOLN
INTRAMUSCULAR | Status: DC | PRN
  Administered 2021-11-26: 4 mg via INTRAVENOUS

## 2021-11-26 MED ORDER — TRANEXAMIC ACID-NACL 1000-0.7 MG/100ML-% IV SOLN: 1000.0000 mg | Freq: Once | INTRAVENOUS | Status: DC

## 2021-11-26 MED ORDER — FENTANYL CITRATE (PF) 100 MCG/2ML IJ SOLN
INTRAMUSCULAR | Status: AC
  Filled 2021-11-26: qty 2

## 2021-11-26 MED ORDER — MIDAZOLAM HCL 2 MG/2ML IJ SOLN
INTRAMUSCULAR | Status: AC
  Filled 2021-11-26: qty 2

## 2021-11-26 MED ORDER — CHLORHEXIDINE GLUCONATE 0.12 % MT SOLN
15.0000 mL | Freq: Once | OROMUCOSAL | Status: AC
  Administered 2021-11-26: 15 mL via OROMUCOSAL

## 2021-11-26 MED ORDER — ONDANSETRON HCL 4 MG/2ML IJ SOLN
4.0000 mg | Freq: Once | INTRAMUSCULAR | Status: AC
Start: 2021-11-26 — End: 2021-11-26
  Administered 2021-11-26: 4 mg via INTRAVENOUS
  Filled 2021-11-26: qty 2

## 2021-11-26 MED ORDER — GLYCOPYRROLATE 0.2 MG/ML IJ SOLN
INTRAMUSCULAR | Status: DC | PRN
  Administered 2021-11-26: .1 mg via INTRAVENOUS

## 2021-11-26 MED ORDER — LACTATED RINGERS IV BOLUS: 250.0000 mL | Freq: Once | INTRAVENOUS | Status: DC

## 2021-11-26 MED ORDER — FENTANYL CITRATE (PF) 100 MCG/2ML IJ SOLN
INTRAMUSCULAR | Status: DC | PRN
  Administered 2021-11-26: 50 ug via INTRAVENOUS

## 2021-11-26 MED ORDER — PROPOFOL 500 MG/50ML IV EMUL
INTRAVENOUS | Status: DC | PRN
  Administered 2021-11-26: 125 ug/kg/min via INTRAVENOUS

## 2021-11-26 MED ORDER — BUPIVACAINE LIPOSOME 1.3 % IJ SUSP
INTRAMUSCULAR | Status: DC | PRN
  Administered 2021-11-26: 10 mL

## 2021-11-26 MED ORDER — BUPIVACAINE LIPOSOME 1.3 % IJ SUSP
INTRAMUSCULAR | Status: AC
  Filled 2021-11-26: qty 10

## 2021-11-26 MED ORDER — LACTATED RINGERS IV BOLUS
500.0000 mL | Freq: Once | INTRAVENOUS | Status: AC
  Administered 2021-11-26: 500 mL via INTRAVENOUS

## 2021-11-26 MED ORDER — ACETAMINOPHEN 500 MG PO TABS
1000.0000 mg | ORAL_TABLET | Freq: Once | ORAL | Status: AC
  Administered 2021-11-26: 1000 mg via ORAL
  Filled 2021-11-26: qty 2

## 2021-11-26 MED ORDER — METHOCARBAMOL 500 MG IVPB - SIMPLE MED: 500.0000 mg | Freq: Four times a day (QID) | INTRAVENOUS | Status: DC | PRN

## 2021-11-26 MED ORDER — CEFAZOLIN SODIUM-DEXTROSE 2-4 GM/100ML-% IV SOLN
2.0000 g | INTRAVENOUS | Status: AC
  Administered 2021-11-26: 2 g via INTRAVENOUS
  Filled 2021-11-26: qty 100

## 2021-11-26 MED ORDER — PROPOFOL 10 MG/ML IV BOLUS
INTRAVENOUS | Status: AC
  Filled 2021-11-26: qty 20

## 2021-11-26 MED ORDER — OXYCODONE HCL 5 MG PO TABS
5.0000 mg | ORAL_TABLET | Freq: Once | ORAL | Status: AC
  Administered 2021-11-26: 5 mg via ORAL

## 2021-11-26 MED ORDER — PROPOFOL 1000 MG/100ML IV EMUL
INTRAVENOUS | Status: AC
  Filled 2021-11-26: qty 100

## 2021-11-26 MED ORDER — LACTATED RINGERS IV BOLUS
250.0000 mL | Freq: Once | INTRAVENOUS | Status: AC
  Administered 2021-11-26: 250 mL via INTRAVENOUS

## 2021-11-26 MED ORDER — ASPIRIN 81 MG PO TBEC: 81.0000 mg | DELAYED_RELEASE_TABLET | Freq: Two times a day (BID) | ORAL | 0 refills | Status: AC

## 2021-11-26 MED ORDER — OXYCODONE-ACETAMINOPHEN 5-325 MG PO TABS: 1.0000 | ORAL_TABLET | Freq: Four times a day (QID) | ORAL | 0 refills | Status: AC | PRN

## 2021-11-26 MED ORDER — OXYCODONE HCL 5 MG PO TABS
ORAL_TABLET | ORAL | Status: AC
  Filled 2021-11-26: qty 1

## 2021-11-26 MED ORDER — METHOCARBAMOL 500 MG PO TABS: 500.0000 mg | ORAL_TABLET | Freq: Four times a day (QID) | ORAL | Status: DC | PRN

## 2021-11-26 MED ORDER — BUPIVACAINE-EPINEPHRINE (PF) 0.25% -1:200000 IJ SOLN
INTRAMUSCULAR | Status: DC | PRN
  Administered 2021-11-26: 30 mL

## 2021-11-26 MED ORDER — HYDROMORPHONE HCL 1 MG/ML IJ SOLN: 0.2500 mg | INTRAMUSCULAR | Status: DC | PRN

## 2021-11-26 MED ORDER — BUPIVACAINE IN DEXTROSE 0.75-8.25 % IT SOLN
INTRATHECAL | Status: DC | PRN
  Administered 2021-11-26: 1.6 mL via INTRATHECAL

## 2021-11-26 MED ORDER — 0.9 % SODIUM CHLORIDE (POUR BTL) OPTIME
TOPICAL | Status: DC | PRN
  Administered 2021-11-26: 1000 mL

## 2021-11-26 MED ORDER — ORAL CARE MOUTH RINSE: 15.0000 mL | Freq: Once | OROMUCOSAL | Status: AC

## 2021-11-26 MED ORDER — TRANEXAMIC ACID 1000 MG/10ML IV SOLN
INTRAVENOUS | Status: DC | PRN
  Administered 2021-11-26: 2000 mg via TOPICAL

## 2021-11-26 MED ORDER — BUPIVACAINE LIPOSOME 1.3 % IJ SUSP: 10.0000 mL | Freq: Once | INTRAMUSCULAR | Status: DC

## 2021-11-26 MED ORDER — MIDAZOLAM HCL 5 MG/5ML IJ SOLN
INTRAMUSCULAR | Status: DC | PRN
  Administered 2021-11-26: 2 mg via INTRAVENOUS

## 2021-11-26 SURGICAL SUPPLY — 47 items
BAG COUNTER SPONGE SURGICOUNT (BAG) IMPLANT
BAG DECANTER FOR FLEXI CONT (MISCELLANEOUS) ×2 IMPLANT
BAG SPNG CNTER NS LX DISP (BAG)
BLADE SAW SGTL 18X1.27X75 (BLADE) ×2 IMPLANT
BOOTIES KNEE HIGH SLOAN (MISCELLANEOUS) ×2 IMPLANT
CELLS DAT CNTRL 66122 CELL SVR (MISCELLANEOUS) ×1 IMPLANT
COVER PERINEAL POST (MISCELLANEOUS) ×2 IMPLANT
COVER SURGICAL LIGHT HANDLE (MISCELLANEOUS) ×2 IMPLANT
CUP ACET GRIPTION SERIS 56 100 (Trauma) IMPLANT
DRAPE FOOT SWITCH (DRAPES) ×2 IMPLANT
DRAPE IMP U-DRAPE 54X76 (DRAPES) ×2 IMPLANT
DRAPE STERI IOBAN 125X83 (DRAPES) ×2 IMPLANT
DRAPE U-SHAPE 47X51 STRL (DRAPES) ×4 IMPLANT
DRSG AQUACEL AG ADV 3.5X 6 (GAUZE/BANDAGES/DRESSINGS) ×2 IMPLANT
DURAPREP 26ML APPLICATOR (WOUND CARE) ×2 IMPLANT
ELECT BLADE TIP CTD 4 INCH (ELECTRODE) ×1 IMPLANT
ELECT REM PT RETURN 15FT ADLT (MISCELLANEOUS) ×2 IMPLANT
ELIMINATOR HOLE APEX DEPUY (Hips) ×1 IMPLANT
GIPTION SERIES 56 100 (Trauma) ×2 IMPLANT
GLOVE BIO SURGEON STRL SZ8 (GLOVE) ×4 IMPLANT
GLOVE BIOGEL PI IND STRL 8 (GLOVE) ×2 IMPLANT
GLOVE BIOGEL PI INDICATOR 8 (GLOVE) ×2
GOWN STRL REUS W/ TWL XL LVL3 (GOWN DISPOSABLE) ×2 IMPLANT
GOWN STRL REUS W/TWL XL LVL3 (GOWN DISPOSABLE) ×4
HEAD M SROM 36MM 2 (Hips) IMPLANT
HOLDER FOLEY CATH W/STRAP (MISCELLANEOUS) ×2 IMPLANT
KIT TURNOVER KIT A (KITS) IMPLANT
MANIFOLD NEPTUNE II (INSTRUMENTS) ×2 IMPLANT
NEEDLE HYPO 22GX1.5 SAFETY (NEEDLE) ×2 IMPLANT
NS IRRIG 1000ML POUR BTL (IV SOLUTION) ×2 IMPLANT
PACK ANTERIOR HIP CUSTOM (KITS) ×2 IMPLANT
PENCIL SMOKE EVACUATOR (MISCELLANEOUS) IMPLANT
PINNACLE ALTRX PLUS 4 N 36X56 (Hips) ×1 IMPLANT
PROTECTOR NERVE ULNAR (MISCELLANEOUS) ×2 IMPLANT
RETRACTOR WND ALEXIS 18 MED (MISCELLANEOUS) ×1 IMPLANT
RTRCTR WOUND ALEXIS 18CM MED (MISCELLANEOUS) ×2
SPIKE FLUID TRANSFER (MISCELLANEOUS) ×2 IMPLANT
SROM M HEAD 36MM 2 (Hips) ×2 IMPLANT
STEM FEMORAL SZ6 HIGH ACTIS (Stem) ×1 IMPLANT
SUT ETHIBOND NAB CT1 #1 30IN (SUTURE) ×4 IMPLANT
SUT VIC AB 1 CT1 36 (SUTURE) ×2 IMPLANT
SUT VIC AB 2-0 CT1 27 (SUTURE) ×2
SUT VIC AB 2-0 CT1 TAPERPNT 27 (SUTURE) ×1 IMPLANT
SUT VICRYL AB 3-0 FS1 BRD 27IN (SUTURE) ×2 IMPLANT
SUT VLOC 180 0 24IN GS25 (SUTURE) ×2 IMPLANT
SYR 50ML LL SCALE MARK (SYRINGE) ×2 IMPLANT
TRAY FOLEY MTR SLVR 16FR STAT (SET/KITS/TRAYS/PACK) ×2 IMPLANT

## 2021-11-26 NOTE — Op Note (Signed)
PRE-OP DIAGNOSIS:  LEFT HIP DEGENERATIVE JOINT DISEASE POST-OP DIAGNOSIS: same PROCEDURE:  LEFT TOTAL HIP ARTHROPLASTY ANTERIOR APPROACH ANESTHESIA:  Spinal and MAC SURGEON:  Melrose Nakayama MD ASSISTANT:  Loni Dolly PA-C   INDICATIONS FOR PROCEDURE:  The patient is a 41 y.o. male with a long history of a painful hip.  This has persisted despite multiple conservative measures.  The patient has persisted with pain and dysfunction making rest and activity difficult.  A total hip replacement is offered as surgical treatment.  Informed operative consent was obtained after discussion of possible complications including reaction to anesthesia, infection, neurovascular injury, dislocation, DVT, PE, and death.  The importance of the postoperative rehab program to optimize result was stressed with the patient.  SUMMARY OF FINDINGS AND PROCEDURE:  Under the above anesthesia through a anterior approach an the Hana table a left THR was performed.  The patient had severe degenerative change and excellent bone quality.  We used DePuy components to replace the hip and these were size 6 high offset Actis femur capped with a -2 13m metal hip ball.  On the acetabular side we used a size 56 Gription shell with a plus 4 neutral polyethylene liner.  We did use a hole eliminator.  ALoni DollyPA-C assisted throughout and was invaluable to the completion of the case in that he helped position and retract while I performed the procedure.  He also closed simultaneously to help minimize OR time.  I used fluoroscopy throughout the case to check position of components and leg lengths and read all these views myself.  DESCRIPTION OF PROCEDURE:  The patient was taken to the OR suite where the above anesthetic was applied.  The patient was then positioned on the Hana table supine.  All bony prominences were appropriately padded.  Prep and drape was then performed in normal sterile fashion.  The patient was given kefzol preoperative  antibiotic and an appropriate time out was performed.  We then took an anterior approach to the left hip.  Dissection was taken through adipose to the tensor fascia lata fascia.  This structure was incised longitudinally and we dissected in the intermuscular interval just medial to this muscle.  Cobra retractors were placed superior and inferior to the femoral neck superficial to the capsule.  A capsular incision was then made and the retractors were placed along the femoral neck.  Xray was brought in to get a good level for the femoral neck cut which was made with an oscillating saw and osteotome.  The femoral head was removed with a corkscrew.  The acetabulum was exposed and some labral tissues were excised. Reaming was taken to the inside wall of the pelvis and sequentially up to 1 mm smaller than the actual component.  A trial of components was done and then the aforementioned acetabular shell was placed in appropriate tilt and anteversion confirmed by fluoroscopy. The liner was placed along with the hole eliminator and attention was turned to the femur.  The leg was brought down and over into adduction and the elevator bar was used to raise the femur up gently in the wound.  The piriformis was released with care taken to preserve the obturator internus attachment and all of the posterior capsule. The femur was reamed and then broached to the appropriate size.  A trial reduction was done and the aforementioned head and neck assembly gave uKoreathe best stability in extension with external rotation.  Leg lengths were felt to be about equal  by fluoroscopic exam.  The trial components were removed and the wound irrigated.  We then placed the femoral component in appropriate anteversion.  The head was applied to a dry stem neck and the hip again reduced.  It was again stable in the aforementioned position.  The would was irrigated again followed by re-approximation of anterior capsule with ethibond suture. Tensor  fascia was repaired with V-loc suture  followed by deep closure with #O and #2 undyed vicryl.  Skin was closed with subQ stitch and steristrips followed by a sterile dressing.  EBL and IOF can be obtained from anesthesia records.  DISPOSITION:  The patient was taken to PACU to potentially go home same day depending on ability to walk and tolerate liquids.

## 2021-11-26 NOTE — Anesthesia Procedure Notes (Signed)
Spinal  Patient location during procedure: OR Start time: 11/26/2021 11:56 AM End time: 11/26/2021 12:00 PM Reason for block: surgical anesthesia Staffing Performed: resident/CRNA  Anesthesiologist: Gaynelle Adu, MD Resident/CRNA: Marny Lowenstein, CRNA Performed by: Marny Lowenstein, CRNA Authorized by: Gaynelle Adu, MD   Preanesthetic Checklist Completed: patient identified, IV checked, site marked, risks and benefits discussed, surgical consent, monitors and equipment checked, pre-op evaluation and timeout performed Spinal Block Patient position: sitting Prep: DuraPrep Patient monitoring: heart rate, continuous pulse ox and blood pressure Approach: midline Location: L3-4 Injection technique: single-shot Needle Needle type: Pencan  Needle gauge: 24 G Assessment Sensory level: T6 Events: CSF return

## 2021-11-26 NOTE — Anesthesia Postprocedure Evaluation (Signed)
Anesthesia Post Note  Patient: Luke Williams  Procedure(s) Performed: LEFT TOTAL HIP ARTHROPLASTY ANTERIOR APPROACH (Left: Hip)     Patient location during evaluation: PACU Anesthesia Type: Spinal Level of consciousness: oriented and awake and alert Pain management: pain level controlled Vital Signs Assessment: post-procedure vital signs reviewed and stable Respiratory status: spontaneous breathing and respiratory function stable Cardiovascular status: blood pressure returned to baseline and stable Postop Assessment: no headache, no backache, no apparent nausea or vomiting, spinal receding and patient able to bend at knees Anesthetic complications: no   No notable events documented.  Last Vitals:  Vitals:   11/26/21 1415 11/26/21 1425  BP: 134/81 136/86  Pulse: 75 76  Resp: 16 10  Temp:  36.4 C  SpO2: 100% 99%    Last Pain:  Vitals:   11/26/21 1425  TempSrc:   PainSc: 0-No pain                 Partick Musselman,W. EDMOND

## 2021-11-26 NOTE — Anesthesia Preprocedure Evaluation (Addendum)
Anesthesia Evaluation  Patient identified by MRN, date of birth, ID band Patient awake    Reviewed: Allergy & Precautions, H&P , NPO status , Patient's Chart, lab work & pertinent test results  Airway Mallampati: III  TM Distance: >3 FB Neck ROM: Full    Dental no notable dental hx. (+) Teeth Intact, Dental Advisory Given   Pulmonary asthma , Patient abstained from smoking., former smoker,    Pulmonary exam normal breath sounds clear to auscultation       Cardiovascular negative cardio ROS   Rhythm:Regular Rate:Normal     Neuro/Psych negative neurological ROS  negative psych ROS   GI/Hepatic Neg liver ROS, GERD  Medicated,  Endo/Other  negative endocrine ROS  Renal/GU negative Renal ROS  negative genitourinary   Musculoskeletal   Abdominal   Peds  Hematology negative hematology ROS (+)   Anesthesia Other Findings   Reproductive/Obstetrics negative OB ROS                            Anesthesia Physical Anesthesia Plan  ASA: 2  Anesthesia Plan: Spinal   Post-op Pain Management: Tylenol PO (pre-op)*   Induction: Intravenous  PONV Risk Score and Plan: 2 and Propofol infusion, Midazolam and Ondansetron  Airway Management Planned: Natural Airway and Simple Face Mask  Additional Equipment:   Intra-op Plan:   Post-operative Plan:   Informed Consent: I have reviewed the patients History and Physical, chart, labs and discussed the procedure including the risks, benefits and alternatives for the proposed anesthesia with the patient or authorized representative who has indicated his/her understanding and acceptance.     Dental advisory given  Plan Discussed with: CRNA  Anesthesia Plan Comments:         Anesthesia Quick Evaluation

## 2021-11-26 NOTE — Transfer of Care (Signed)
Immediate Anesthesia Transfer of Care Note  Patient: Luke Williams  Procedure(s) Performed: LEFT TOTAL HIP ARTHROPLASTY ANTERIOR APPROACH (Left: Hip)  Patient Location: PACU  Anesthesia Type:MAC and Spinal  Level of Consciousness: awake, alert  and oriented  Airway & Oxygen Therapy: Patient Spontanous Breathing  Post-op Assessment: Report given to RN and Post -op Vital signs reviewed and stable  Post vital signs: Reviewed and stable  Last Vitals:  Vitals Value Taken Time  BP    Temp    Pulse 72 11/26/21 1349  Resp 10 11/26/21 1349  SpO2 100 % 11/26/21 1349  Vitals shown include unvalidated device data.  Last Pain:  Vitals:   11/26/21 1030  TempSrc: Oral  PainSc:       Patients Stated Pain Goal: 3 (01/00/71 2197)  Complications: No notable events documented.

## 2021-11-26 NOTE — Evaluation (Signed)
Physical Therapy Evaluation Patient Details Name: Luke Williams MRN: [id number] DOB: 03-25-1981 Today's Date: 11/26/2021  History of Present Illness  Pt is a 41yo male presenting s/p L-THA, AA on 11/26/21. PMH: Asthma, cervical C6-c7 fusion  Clinical Impression  Luke Williams is a 41 y.o. male POD 0 s/p L-THA, AA. Patient reports modified independence using SPCfor mobility at baseline. Patient is now limited by functional impairments (see PT problem list below) and requires min guard for transfers and gait with RW. Patient was able to ambulate 60 feet with RW and min guaard and cues for safe walker management. Patient educated on safe sequencing for stair mobility and verbalized safe guarding position for people assisting with mobility. Patient instructed in exercises to facilitate ROM and circulation. Patient will benefit from continued skilled PT interventions to address impairments and progress towards PLOF. Patient has met mobility goals at adequate level for discharge home; will continue to follow if pt continues acute stay to progress towards Mod I goals.       Recommendations for follow up therapy are one component of a multi-disciplinary discharge planning process, led by the attending physician.  Recommendations may be updated based on patient status, additional functional criteria and insurance authorization.  Follow Up Recommendations Follow physician's recommendations for discharge plan and follow up therapies    Assistance Recommended at Discharge PRN  Patient can return home with the following  A little help with walking and/or transfers;A little help with bathing/dressing/bathroom;Assistance with cooking/housework;Assist for transportation;Help with stairs or ramp for entrance    Equipment Recommendations Rolling walker (2 wheels)  Recommendations for Other Services       Functional Status Assessment Patient has had a recent decline in their functional status and  demonstrates the ability to make significant improvements in function in a reasonable and predictable amount of time.     Precautions / Restrictions Precautions Precautions: None Restrictions Weight Bearing Restrictions: No Other Position/Activity Restrictions: WBAT      Mobility  Bed Mobility Overal bed mobility: Modified Independent                  Transfers Overall transfer level: Needs assistance Equipment used: Rolling walker (2 wheels) Transfers: Sit to/from Stand Sit to Stand: Min guard           General transfer comment: For safety only, no physical assist required.    Ambulation/Gait Ambulation/Gait assistance: Min guard Gait Distance (Feet): 60 Feet Assistive device: Rolling walker (2 wheels) Gait Pattern/deviations: Step-to pattern, Step-through pattern Gait velocity: decreased     General Gait Details: Pt ambulated with RW and min guard assist, no physical assist required or overt LOB noted. Pt began with step to pattern progressed to step through. At start of ambulation task pt was walking with just L forefoot, after cuing pt able to walk using whole foot.  Stairs Stairs: Yes Stairs assistance: Min guard Stair Management: One rail Right, Step to pattern, Forwards, With cane Number of Stairs: 2 General stair comments: Educated pt about safe stair mobility using 1rail and SPC, pt verbalized understanding. Pt demonstrated safe technique with verbal cues for sequencing, min guard, no overt LOB. Educated pt about pacing day to only go up/down interior stairs 1x daily and to have a chair nearby to rest after performing stair mobility, pt verbaliz3ed understanding.  Wheelchair Mobility    Modified Rankin (Stroke Patients Only)       Balance Overall balance assessment: Mild deficits observed, not formally tested  Pertinent Vitals/Pain Pain Assessment Pain Assessment: 0-10 Pain Score: 6   Pain Location: left hip Pain Descriptors / Indicators: Operative site guarding Pain Intervention(s): Limited activity within patient's tolerance, Monitored during session, Repositioned    Home Living Family/patient expects to be discharged to:: Private residence Living Arrangements: Spouse/significant other Available Help at Discharge: Family;Available 24 hours/day Type of Home: House Home Access: Stairs to enter Entrance Stairs-Rails: Right Entrance Stairs-Number of Steps: 2 Alternate Level Stairs-Number of Steps: 14 Home Layout: Two level;Bed/bath upstairs Home Equipment: Cane - single point;Crutches      Prior Function Prior Level of Function : Independent/Modified Independent             Mobility Comments: SPC at least 50% of the time mostly while in the community ADLs Comments: ind     Hand Dominance        Extremity/Trunk Assessment        Lower Extremity Assessment Lower Extremity Assessment: RLE deficits/detail;LLE deficits/detail RLE Deficits / Details: MMT ank PF/DF 5/5/ RLE Sensation: decreased light touch (pins and needles) LLE Deficits / Details: MMT ank PF/DF 5/5 LLE Sensation: decreased light touch (pins and needles)    Cervical / Trunk Assessment Cervical / Trunk Assessment: Neck Surgery  Communication   Communication: No difficulties  Cognition Arousal/Alertness: Awake/alert Behavior During Therapy: WFL for tasks assessed/performed Overall Cognitive Status: Within Functional Limits for tasks assessed                                          General Comments      Exercises Total Joint Exercises Ankle Circles/Pumps: AROM, Both, 10 reps Quad Sets: AROM, Left, 5 reps Short Arc Quad: AROM, Left, 5 reps Heel Slides: AROM, Left, 5 reps Hip ABduction/ADduction: AROM, Left, 5 reps Long Arc Quad:  (educated not performed) Marching in Standing:  (educated not performed) Standing Hip Extension:  (educated not performed)    Assessment/Plan    PT Assessment Patient needs continued PT services  PT Problem List Decreased strength;Decreased range of motion;Decreased activity tolerance;Decreased balance;Decreased mobility;Decreased coordination;Decreased knowledge of use of DME;Pain       PT Treatment Interventions DME instruction;Gait training;Stair training;Functional mobility training;Therapeutic activities;Therapeutic exercise;Balance training;Neuromuscular re-education;Patient/family education    PT Goals (Current goals can be found in the Care Plan section)  Acute Rehab PT Goals Patient Stated Goal: Walk without pain PT Goal Formulation: With patient Time For Goal Achievement: 12/03/21 Potential to Achieve Goals: Good    Frequency 7X/week     Co-evaluation               AM-PAC PT "6 Clicks" Mobility  Outcome Measure Help needed turning from your back to your side while in a flat bed without using bedrails?: None Help needed moving from lying on your back to sitting on the side of a flat bed without using bedrails?: None Help needed moving to and from a bed to a chair (including a wheelchair)?: A Little Help needed standing up from a chair using your arms (e.g., wheelchair or bedside chair)?: A Little Help needed to walk in hospital room?: A Little Help needed climbing 3-5 steps with a railing? : A Little 6 Click Score: 20    End of Session Equipment Utilized During Treatment: Gait belt Activity Tolerance: Patient tolerated treatment well Patient left: in chair;with call bell/phone within reach Nurse Communication: Mobility status PT Visit Diagnosis: Difficulty in walking,  not elsewhere classified (R26.2);Pain Pain - Right/Left: Left Pain - part of body: Hip    Time: 1500-1535 PT Time Calculation (min) (ACUTE ONLY): 35 min   Charges:   PT Evaluation $PT Eval Low Complexity: 1 Low PT Treatments $Gait Training: 8-22 mins        Coolidge Breeze, PT, DPT [facility]  Department Office: [phone] Pager: [phone]  Coolidge Breeze 11/26/2021, 3:50 PM

## 2021-11-26 NOTE — Interval H&P Note (Signed)
History and Physical Interval Note:  11/26/2021 11:01 AM  Luke Williams  has presented today for surgery, with the diagnosis of LEFT HIP AVASCULAR NECROSIS/DEGENERATIVE JOINT DISEASE.  The various methods of treatment have been discussed with the patient and family. After consideration of risks, benefits and other options for treatment, the patient has consented to  Procedure(s) with comments: LEFT TOTAL HIP ARTHROPLASTY ANTERIOR APPROACH (Left) - LENGTH OF SURGERY: 120 MINUTES as a surgical intervention.  The patient's history has been reviewed, patient examined, no change in status, stable for surgery.  I have reviewed the patient's chart and labs.  Questions were answered to the patient's satisfaction.     Velna Ochs

## 2021-11-26 NOTE — ED Provider Notes (Signed)
[facility] Provider Note: Lowella Dell, MD, FACEP  CSN: [id number] MRN: [id number] ARRIVAL: 11/26/21 at 2121 ROOM: WA13/WA13   CHIEF COMPLAINT  Hip Pain   HISTORY OF PRESENT ILLNESS  11/26/21 11:01 PM Luke Williams is a 41 y.o. male who underwent a left total hip arthroplasty today by Dr. [name].  He was discharged at 3 PM with prescriptions for Percocet and tizanidine.  He took 1 Percocet at 3 PM and another 1 at 6 PM along with tizanidine without relief of his pain.  He called EMS who gave him 100 mcg of fentanyl and 17.4 mg of ketamine IV.  Despite these medications he still rates his pain as a 10 out of 10.  He is barely able to move his left hip and has not been able to even attempt weightbearing.  He called Dr. Nolon Nations office and was told he could double up his Percocet but this did not help.  He was previously on chronic pain medication.  Specifically he was receiving prescriptions for 120, 10/325 hydrocodone/APAP tablets.  His last such prescription was filled on 05/09/2021.  He had 1 more prescription for 28 tablets filled on 08/11/2021; this prescription was written by a physician in [place].  Past Medical History:  Diagnosis Date   Asthma    Chickenpox    Seasonal allergies     Past Surgical History:  Procedure Laterality Date   LEG SURGERY  10 or 11 years ago   NECK SURGERY     anterior cervical   POSTERIOR CERVICAL FUSION/FORAMINOTOMY N/A 11/17/2017   Procedure: CERVICAL SIX- CERVICAL SEVEN POSTERIOR CERVICAL FUSION;  Surgeon: Maeola Harman, MD;  Location: [facility] OR;  Service: Neurosurgery;  Laterality: N/A;  CERVICAL 6- CERVICAL 7 POSTERIOR CERVICAL FUSION    Family History  Problem Relation Age of Onset   Arthritis Mother    Hypertension Mother    Alcohol abuse Father    Hyperlipidemia Father    Hypertension Father    Arthritis Maternal Grandmother    Stroke Maternal Grandmother    Hypertension Maternal Grandmother    Colon cancer Maternal  Grandfather    Stroke Maternal Grandfather     Social History   Tobacco Use   Smoking status: Former    Packs/day: 1.00    Types: Cigarettes    Quit date: 11/18/2021    Years since quitting: 0.0   Smokeless tobacco: Never  Vaping Use   Vaping Use: Never used  Substance Use Topics   Alcohol use: Yes    Comment: two times per week - beer   Drug use: Yes    Prior to Admission medications   Medication Sig Start Date End Date Taking? Authorizing Provider  albuterol (VENTOLIN HFA) 108 (90 Base) MCG/ACT inhaler Inhale 2 puffs into the lungs every 6 (six) hours as needed for shortness of breath. 03/26/20  Yes Doreene Nest, NP  oxyCODONE-acetaminophen (PERCOCET) 5-325 MG tablet Take 1-2 tablets by mouth every 6 (six) hours as needed for severe pain. 11/26/21 11/26/22 Yes Elodia Florence, PA-C  tiZANidine (ZANAFLEX) 4 MG tablet Take 1 tablet (4 mg total) by mouth every 6 (six) hours as needed for muscle spasms. 11/26/21 11/26/22 Yes Elodia Florence, PA-C  acetaminophen (TYLENOL) 500 MG tablet Take 2 tablets (1,000 mg total) by mouth every 8 (eight) hours as needed for moderate pain. Patient not taking: Reported on 11/15/2021 01/30/20   Darr, Gerilyn Pilgrim, PA-C  aspirin EC 81 MG tablet Take 1 tablet (81 mg total) by  mouth 2 (two) times daily. For 2 weeks then once a day for 2 weeks for DVT prevention. 11/26/21 11/26/22  Loni Dolly, PA-C  pantoprazole (PROTONIX) 20 MG tablet Take 1 tablet (20 mg total) by mouth daily. For heartburn. Patient not taking: Reported on 11/15/2021 03/30/20   Pleas Koch, NP  omeprazole (PRILOSEC) 20 MG capsule Take 1 capsule (20 mg total) by mouth daily. For heartburn. 03/26/20 03/30/20  Pleas Koch, NP    Allergies Patient has no known allergies.   REVIEW OF SYSTEMS  Negative except as noted here or in the History of Present Illness.   PHYSICAL EXAMINATION  Initial Vital Signs Blood pressure 135/70, pulse 99, temperature 98.3 F (36.8 C), temperature source  Oral, resp. rate 20, height 5\' 10"  (1.778 m), weight 86 kg, SpO2 97 %.  Examination General: Well-developed, well-nourished male in no acute distress; appearance consistent with age of record HENT: normocephalic; atraumatic Eyes: Normal appearance Neck: supple Heart: regular rate and rhythm Lungs: clear to auscultation bilaterally Abdomen: soft; nondistended; nontender; bowel sounds present Extremities: No deformity; bandaged left THA incision; left hip not manipulated due to pain; pulses +1 in feet bilaterally Neurologic: Awake, alert and oriented; motor function intact in all extremities and symmetric; no facial droop Skin: Warm and dry Psychiatric: Flat affect   RESULTS  Summary of this visit's results, reviewed and interpreted by myself:   EKG Interpretation  Date/Time:    Ventricular Rate:    PR Interval:    QRS Duration:   QT Interval:    QTC Calculation:   R Axis:     Text Interpretation:         Laboratory Studies: No results found for this or any previous visit (from the past 24 hour(s)). Imaging Studies: DG Hip Unilat W or Wo Pelvis 2-3 Views Left  Result Date: 11/26/2021 CLINICAL DATA:  Left hip arthroplasty. EXAM: DG HIP (WITH OR WITHOUT PELVIS) 2-3V LEFT COMPARISON:  None Available. FINDINGS: There is a total left hip arthroplasty. The arthroplasty components appear intact and in anatomic alignment. There is no acute fracture or dislocation. The bones are well mineralized. Postsurgical changes of the soft tissues with subcutaneous air in the lateral pelvis. IMPRESSION: Total left hip arthroplasty. Electronically Signed   By: Anner Crete M.D.   On: 11/26/2021 22:05   DG HIP UNILAT WITH PELVIS 2-3 VIEWS LEFT  Result Date: 11/26/2021 CLINICAL DATA:  Left hip surgery EXAM: DG HIP (WITH OR WITHOUT PELVIS) 2-3V LEFT COMPARISON:  None Available. FINDINGS: Intraoperative images demonstrate left total hip arthroplasty. IMPRESSION: Post left total hip arthroplasty.  Electronically Signed   By: Macy Mis M.D.   On: 11/26/2021 13:44   DG C-Arm 1-60 Min-No Report  Result Date: 11/26/2021 Fluoroscopy was utilized by the requesting physician.  No radiographic interpretation.   DG C-Arm 1-60 Min-No Report  Result Date: 11/26/2021 Fluoroscopy was utilized by the requesting physician.  No radiographic interpretation.    ED COURSE and MDM  Nursing notes, initial and subsequent vitals signs, including pulse oximetry, reviewed and interpreted by myself.  Vitals:   11/26/21 2145 11/26/21 2230 11/26/21 2330 11/27/21 0015  BP: (!) 148/88 135/70 (!) 155/91 (!) 149/92  Pulse: 87 99 97 (!) 103  Resp: 15 20 (!) 21 (!) 21  Temp:      TempSrc:      SpO2: 97% 97% 97% 100%  Weight:      Height:       Medications  ondansetron (ZOFRAN) injection  4 mg (4 mg Intravenous Given 11/26/21 2317)  HYDROmorphone (DILAUDID) injection 1 mg (1 mg Intravenous Given 11/26/21 2316)  HYDROmorphone (DILAUDID) injection 1 mg (1 mg Intravenous Given 11/27/21 0024)   12:39 AM While I was away from the ED attending to a CODE BLUE upstairs the patient reportedly became argumentative about not receiving adequate analgesia.  He had received 1 mg of Dilaudid.  He stated he should be on a Dilaudid PCA.  He reportedly tore off his monitor leads, removed his IV and left the ED.   PROCEDURES  Procedures   ED DIAGNOSES     ICD-10-CM   1. Drug-seeking behavior  Z76.5          Nazly Digilio, MD 11/27/21 0041

## 2021-11-26 NOTE — ED Triage Notes (Signed)
BIBA for left hip pain, s/p left hip replacement today, d/c @ 3pm with percocet 5/325 and tizanidine 4mg . Patient took 1 percocet @ 3pm and 6pm along with tizanidine with no relief. EMS admin fent and ketamine 17.4mg 

## 2021-11-27 ENCOUNTER — Encounter (HOSPITAL_COMMUNITY): Payer: Self-pay | Admitting: Orthopaedic Surgery

## 2021-11-27 MED ORDER — HYDROMORPHONE HCL 1 MG/ML IJ SOLN
1.0000 mg | Freq: Once | INTRAMUSCULAR | Status: AC
  Administered 2021-11-27: 1 mg via INTRAVENOUS
  Filled 2021-11-27: qty 1

## 2021-11-27 NOTE — ED Notes (Signed)
Family member to desk asking if we are going to control his pain. Explained to family member that he has received medication on truck and medication in ER for pain and we have to be careful with narcotics due to compromising the respiratory system and the drive to breath and MD is aware, family member states "what are you going to do let him have a heart attack from being in so much pain this isnt right" asked charge nurse to see patient.

## 2021-11-27 NOTE — ED Notes (Signed)
Ama refusal signed with charge

## 2021-11-27 NOTE — ED Notes (Signed)
Spoke with family at bedside per request. Family requesting patient be admitted, explained to family and patient that he will need to meet admission requirement before that decision can be made. Patient became upset proceeding to remove monitors and equipment. Family concerned that patient's pain is still not controlled. Dr. Read Drivers made aware of family and patient concern.

## 2021-11-27 NOTE — ED Notes (Signed)
Pt pulled IV out and states he is leaving, refused to sign AMA paper. MD and charge nurse aware

## 2022-07-14 ENCOUNTER — Encounter: Payer: Self-pay | Admitting: Emergency Medicine

## 2022-07-14 ENCOUNTER — Ambulatory Visit
Admission: EM | Admit: 2022-07-14 | Discharge: 2022-07-14 | Disposition: A | Discharge status: Home / Self Care | Payer: Commercial Managed Care - HMO | Attending: Internal Medicine | Admitting: Internal Medicine

## 2022-07-14 DIAGNOSIS — J4521 Mild intermittent asthma with (acute) exacerbation: Secondary | ICD-10-CM | POA: Diagnosis not present

## 2022-07-14 DIAGNOSIS — J069 Acute upper respiratory infection, unspecified: Secondary | ICD-10-CM

## 2022-07-14 MED ORDER — ALBUTEROL SULFATE (2.5 MG/3ML) 0.083% IN NEBU
2.5000 mg | INHALATION_SOLUTION | Freq: Once | RESPIRATORY_TRACT | Status: AC
  Administered 2022-07-14: 2.5 mg via RESPIRATORY_TRACT

## 2022-07-14 MED ORDER — ALBUTEROL SULFATE (2.5 MG/3ML) 0.083% IN NEBU: 2.5000 mg | INHALATION_SOLUTION | Freq: Four times a day (QID) | RESPIRATORY_TRACT | 12 refills | Status: AC | PRN

## 2022-07-14 MED ORDER — PREDNISONE 20 MG PO TABS: 40.0000 mg | ORAL_TABLET | Freq: Every day | ORAL | 0 refills | Status: AC

## 2022-07-14 MED ORDER — METHYLPREDNISOLONE ACETATE 80 MG/ML IJ SUSP
80.0000 mg | Freq: Once | INTRAMUSCULAR | Status: AC
  Administered 2022-07-14: 80 mg via INTRAMUSCULAR

## 2022-07-14 MED ORDER — ALBUTEROL SULFATE HFA 108 (90 BASE) MCG/ACT IN AERS: 1.0000 | INHALATION_SPRAY | Freq: Four times a day (QID) | RESPIRATORY_TRACT | 0 refills | Status: AC | PRN

## 2022-07-14 NOTE — ED Triage Notes (Signed)
Patient c/o cough, congestion, SOB, ear pain and dizziness that has progressively gotten worse x 6 days.  Patient has taken Dayquil and Advil.

## 2022-07-14 NOTE — ED Provider Notes (Addendum)
[facility]    CSN: [id number] Arrival date & time: 07/14/22  1433      History   Chief Complaint Chief Complaint  Patient presents with   Cough    HPI Luke Williams is a 42 y.o. male.   Patient presents with cough, nasal congestion, shortness of breath that started about 6 days ago.  Denies any known sick contacts.  Denies any known fever at home.  Has taken DayQuil and Advil with minimal improvement.  Has taken COVID test at home that was negative.  Patient also reports that he has asthma but did not have albuterol inhaler of his own, therefore he has been using a family member's inhaler with minimal improvement.  Denies chest pain, sore throat, nausea, vomiting, diarrhea, abdominal pain.   Cough   Past Medical History:  Diagnosis Date   Asthma    Chickenpox    Esophageal reflux 08/18/2015   Seasonal allergies     Patient Active Problem List   Diagnosis Date Noted   Primary osteoarthritis of left hip 11/26/2021   Pre-op evaluation 10/10/2020   Acute hip pain, left 03/26/2020   Pseudoarthrosis of cervical spine ([place]) 11/17/2017   Esophageal reflux 08/18/2015   Pain of left lower extremity 08/18/2015   Neck pain 08/18/2015   Asthma 08/18/2015    Past Surgical History:  Procedure Laterality Date   LEG SURGERY  10 or 11 years ago   NECK SURGERY     anterior cervical   POSTERIOR CERVICAL FUSION/FORAMINOTOMY N/A 11/17/2017   Procedure: CERVICAL SIX- CERVICAL SEVEN POSTERIOR CERVICAL FUSION;  Surgeon: Erline Levine, MD;  Location: [facility];  Service: Neurosurgery;  Laterality: N/A;  CERVICAL 6- CERVICAL 7 POSTERIOR CERVICAL FUSION   TOTAL HIP ARTHROPLASTY Left 11/26/2021   Procedure: LEFT TOTAL HIP ARTHROPLASTY ANTERIOR APPROACH;  Surgeon: Melrose Nakayama, MD;  Location: [facility];  Service: Orthopedics;  Laterality: Left;  LENGTH OF SURGERY: 120 MINUTES       Home Medications    Prior to Admission medications   Medication Sig Start Date End Date  Taking? Authorizing Provider  albuterol (PROVENTIL) (2.5 MG/3ML) 0.083% nebulizer solution Take 3 mLs (2.5 mg total) by nebulization every 6 (six) hours as needed for wheezing or shortness of breath. 07/14/22  Yes Cayce Quezada, Michele Rockers, FNP  albuterol (VENTOLIN HFA) 108 (90 Base) MCG/ACT inhaler Inhale 2 puffs into the lungs every 6 (six) hours as needed for shortness of breath. 03/26/20  Yes Pleas Koch, NP  albuterol (VENTOLIN HFA) 108 (90 Base) MCG/ACT inhaler Inhale 1-2 puffs into the lungs every 6 (six) hours as needed for wheezing or shortness of breath. 07/14/22  Yes Treniya Lobb, Hildred Alamin E, FNP  predniSONE (DELTASONE) 20 MG tablet Take 2 tablets (40 mg total) by mouth daily for 5 days. 07/14/22 07/19/22 Yes Maryln Eastham, Michele Rockers, FNP  acetaminophen (TYLENOL) 500 MG tablet Take 2 tablets (1,000 mg total) by mouth every 8 (eight) hours as needed for moderate pain. Patient not taking: Reported on 11/15/2021 01/30/20   Darr, Edison Nasuti, PA-C  aspirin EC 81 MG tablet Take 1 tablet (81 mg total) by mouth 2 (two) times daily. For 2 weeks then once a day for 2 weeks for DVT prevention. 11/26/21 11/26/22  Loni Dolly, PA-C  oxyCODONE-acetaminophen (PERCOCET) 5-325 MG tablet Take 1-2 tablets by mouth every 6 (six) hours as needed for severe pain. 11/26/21 11/26/22  Loni Dolly, PA-C  pantoprazole (PROTONIX) 20 MG tablet Take 1 tablet (20 mg total) by mouth daily. For  heartburn. Patient not taking: Reported on 11/15/2021 03/30/20   Pleas Koch, NP  tiZANidine (ZANAFLEX) 4 MG tablet Take 1 tablet (4 mg total) by mouth every 6 (six) hours as needed for muscle spasms. 11/26/21 11/26/22  Loni Dolly, PA-C  omeprazole (PRILOSEC) 20 MG capsule Take 1 capsule (20 mg total) by mouth daily. For heartburn. 03/26/20 03/30/20  Pleas Koch, NP    Family History Family History  Problem Relation Age of Onset   Arthritis Mother    Hypertension Mother    Alcohol abuse Father    Hyperlipidemia Father    Hypertension Father    Arthritis  Maternal Grandmother    Stroke Maternal Grandmother    Hypertension Maternal Grandmother    Colon cancer Maternal Grandfather    Stroke Maternal Grandfather     Social History Social History   Tobacco Use   Smoking status: Former    Packs/day: 1.00    Types: Cigarettes    Quit date: 11/18/2021    Years since quitting: 0.6   Smokeless tobacco: Never  Vaping Use   Vaping Use: Never used  Substance Use Topics   Alcohol use: Yes    Comment: two times per week - beer   Drug use: Yes     Allergies   Patient has no known allergies.   Review of Systems Review of Systems Per HPI  Physical Exam Triage Vital Signs ED Triage Vitals  Enc Vitals Group     BP 07/14/22 1555 (!) 144/69     Pulse Rate 07/14/22 1555 86     Resp 07/14/22 1555 (!) 28     Temp 07/14/22 1555 98.2 F (36.8 C)     Temp Source 07/14/22 1555 Oral     SpO2 07/14/22 1555 94 %     Weight --      Height --      Head Circumference --      Peak Flow --      Pain Score 07/14/22 1557 0     Pain Loc --      Pain Edu? --      Excl. in [place]? --    No data found.  Updated Vital Signs BP (!) 144/69 (BP Location: Left Arm)   Pulse 86   Temp 98.2 F (36.8 C) (Oral)   Resp (!) 28   SpO2 94%   Visual Acuity Right Eye Distance:   Left Eye Distance:   Bilateral Distance:    Right Eye Near:   Left Eye Near:    Bilateral Near:     Physical Exam Constitutional:      General: He is not in acute distress.    Appearance: Normal appearance. He is not toxic-appearing or diaphoretic.  HENT:     Head: Normocephalic and atraumatic.     Right Ear: Tympanic membrane and ear canal normal.     Left Ear: Tympanic membrane and ear canal normal.     Nose: Congestion present.     Mouth/Throat:     Mouth: Mucous membranes are moist.     Pharynx: No posterior oropharyngeal erythema.  Eyes:     Extraocular Movements: Extraocular movements intact.     Conjunctiva/sclera: Conjunctivae normal.     Pupils: Pupils are  equal, round, and reactive to light.  Cardiovascular:     Rate and Rhythm: Normal rate and regular rhythm.     Pulses: Normal pulses.     Heart sounds: Normal heart sounds.  Pulmonary:  Effort: Pulmonary effort is normal. No respiratory distress.     Breath sounds: No stridor. Wheezing present. No rhonchi or rales.     Comments: Bilateral wheezing to auscultation with mild tachypnea noted. Abdominal:     General: Abdomen is flat. Bowel sounds are normal.     Palpations: Abdomen is soft.  Musculoskeletal:        General: Normal range of motion.     Cervical back: Normal range of motion.  Skin:    General: Skin is warm and dry.  Neurological:     General: No focal deficit present.     Mental Status: He is alert and oriented to person, place, and time. Mental status is at baseline.  Psychiatric:        Mood and Affect: Mood normal.        Behavior: Behavior normal.      UC Treatments / Results  Labs (all labs ordered are listed, but only abnormal results are displayed) Labs Reviewed - No data to display  EKG   Radiology No results found.  Procedures Procedures (including critical care time)  Medications Ordered in UC Medications  albuterol (PROVENTIL) (2.5 MG/3ML) 0.083% nebulizer solution 2.5 mg (2.5 mg Nebulization Given 07/14/22 1604)  methylPREDNISolone acetate (DEPO-MEDROL) injection 80 mg (80 mg Intramuscular Given 07/14/22 1604)  albuterol (PROVENTIL) (2.5 MG/3ML) 0.083% nebulizer solution 2.5 mg (2.5 mg Nebulization Given 07/14/22 1635)    Initial Impression / Assessment and Plan / UC Course  I have reviewed the triage vital signs and the nursing notes.  Pertinent labs & imaging results that were available during my care of the patient were reviewed by me and considered in my medical decision making (see chart for details).     Patient appears to have a viral illness that is causing exacerbation of asthma.  Two separate albuterol nebulizer treatments and 80 mg  Depo-Medrol was administered in urgent care with improvement in wheezing, tachypnea, shortness of breath.  Therefore, patient is safe for discharge.  Oxygen saturation also improved to 95 percent. Will prescribe prednisone for patient to start taking tomorrow as well as a refill on albuterol inhaler.  Recommended nebulizer treatments in this acute phase so the patient was prescribed albuterol nebulizer solution and nebulizer machine was sent home with patient.  Educated the patient the inhaler and nebulizer medication are the same and to use at least 6 hours apart if in the same day.  Patient had negative COVID test at home so COVID testing was deferred.  Do not have any additional viral testing capabilities here in urgent care at this time.  Advised strict return and ER precautions.  Patient verbalized understanding and was agreeable with plan. Final Clinical Impressions(s) / UC Diagnoses   Final diagnoses:  Mild intermittent asthma with acute exacerbation  Viral upper respiratory tract infection with cough     Discharge Instructions      It appears that you are having an asthma flareup.  I have prescribed prednisone which you will start taking tomorrow.  I Have also refilled your inhaler for you to take as needed.  Follow-up if any symptoms persist or worsen.    ED Prescriptions     Medication Sig Dispense Auth. Provider   predniSONE (DELTASONE) 20 MG tablet Take 2 tablets (40 mg total) by mouth daily for 5 days. 10 tablet [place], Hildred Alamin E, [place]   albuterol (VENTOLIN HFA) 108 (90 Base) MCG/ACT inhaler Inhale 1-2 puffs into the lungs every 6 (six) hours as  needed for wheezing or shortness of breath. 1 each [place], Rolly Salter E, FNP   albuterol (PROVENTIL) (2.5 MG/3ML) 0.083% nebulizer solution Take 3 mLs (2.5 mg total) by nebulization every 6 (six) hours as needed for wheezing or shortness of breath. 75 mL Gustavus Bryant, [place]      PDMP not reviewed this encounter.   Gustavus Bryant, [place] 07/14/22  1655    [address], [place] 07/14/22 1655    Gustavus Bryant, [place] 07/14/22 1700

## 2022-07-14 NOTE — Discharge Instructions (Signed)
It appears that you are having an asthma flareup.  I have prescribed prednisone which you will start taking tomorrow.  I Have also refilled your inhaler for you to take as needed.  Follow-up if any symptoms persist or worsen.

## 2024-02-01 IMAGING — RF DG HIP (WITH OR WITHOUT PELVIS) 2-3V*L*
1 series · 4 of 4 positions shown · non-contrast
Comparison: None Available.

CLINICAL DATA: Left hip surgery

EXAM:
DG HIP (WITH OR WITHOUT PELVIS) 2-3V LEFT

[Series 1: unknown protocol · 0.20mm/px · 4 of 4 slices shown]
[im 1/4]
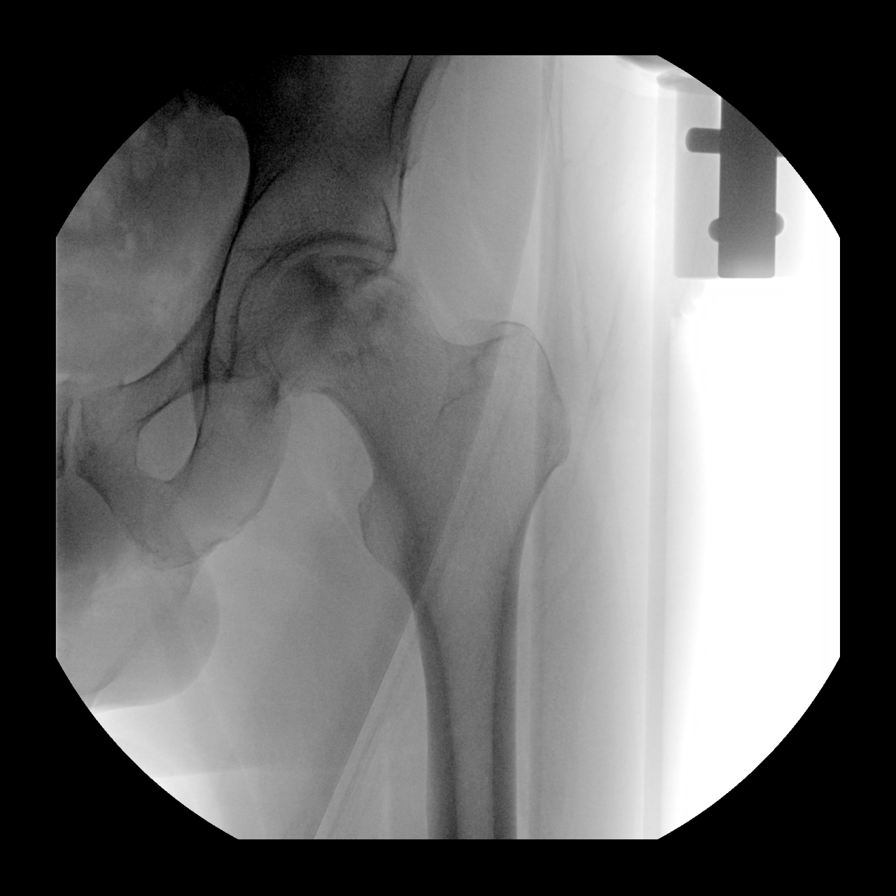
[im 2/4]
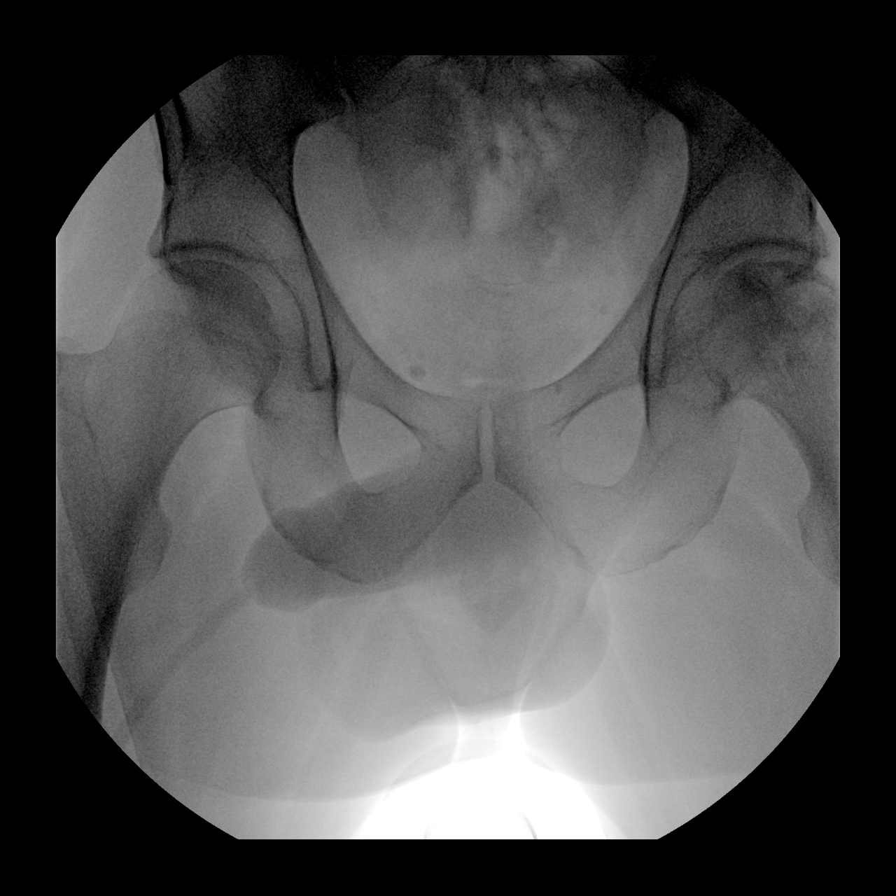
[im 3/4]
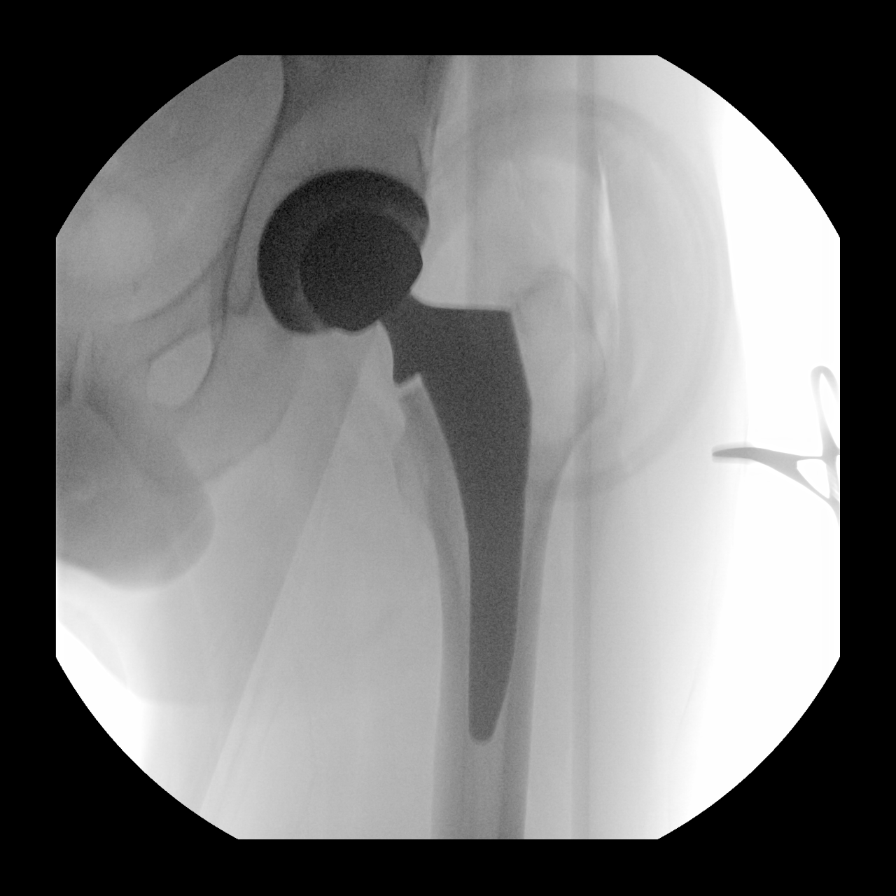
[im 4/4]
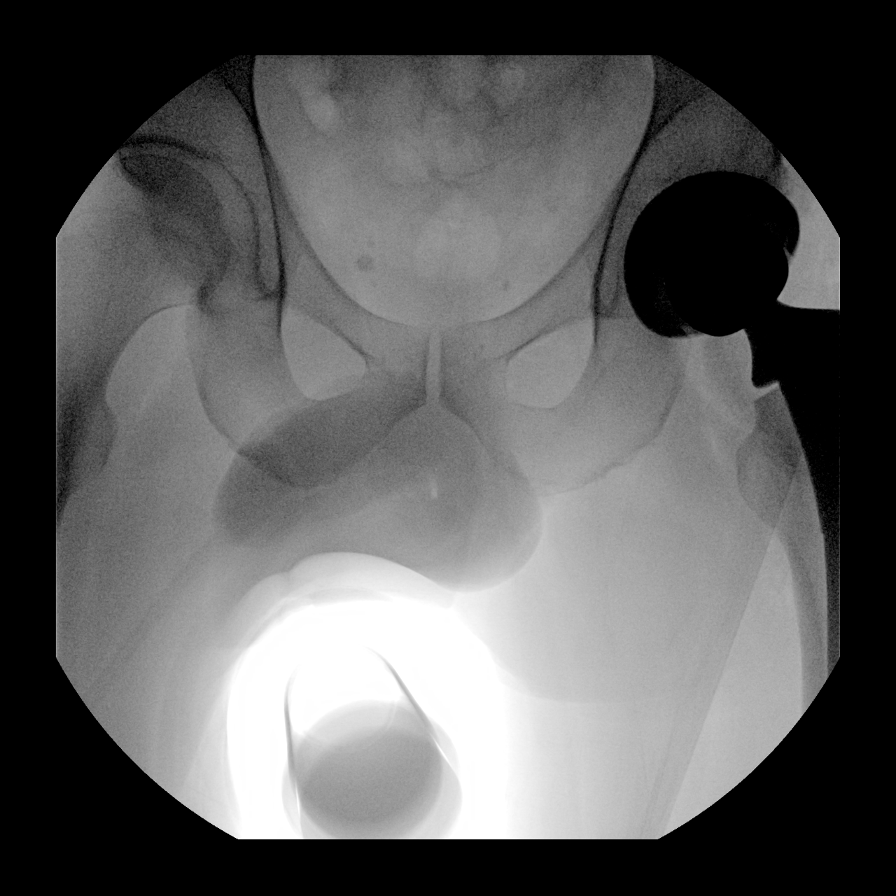

[4 of 4 positions shown; findings below may reference images not displayed]

FINDINGS: Intraoperative images demonstrate left total hip arthroplasty.
IMPRESSION: Post left total hip arthroplasty.
